# Patient Record
Sex: Female | Born: 1985 | Race: Black or African American | Hispanic: Yes | Marital: Married | Smoking: Former smoker
Health system: Southern US, Community
[De-identification: ages and names within clinical notes are randomized; demographics above are authoritative.]

## PROBLEM LIST (undated history)

## (undated) ENCOUNTER — Emergency Department: Admission: EM | Payer: Self-pay | Source: Home / Self Care

## (undated) DIAGNOSIS — N390 Urinary tract infection, site not specified: Secondary | ICD-10-CM

## (undated) DIAGNOSIS — D649 Anemia, unspecified: Secondary | ICD-10-CM

## (undated) DIAGNOSIS — E282 Polycystic ovarian syndrome: Secondary | ICD-10-CM

## (undated) HISTORY — DX: Polycystic ovarian syndrome: E28.2

## (undated) HISTORY — PX: LIPOSUCTION: SHX10

## (undated) HISTORY — PX: BREAST SURGERY: SHX581

---
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
## 2004-10-11 ENCOUNTER — Emergency Department: Payer: Self-pay | Admitting: Emergency Medicine

## 2005-01-29 ENCOUNTER — Emergency Department: Payer: Self-pay | Admitting: Emergency Medicine

## 2006-05-09 ENCOUNTER — Emergency Department: Payer: Self-pay | Admitting: Emergency Medicine

## 2006-05-10 ENCOUNTER — Emergency Department: Payer: Self-pay | Admitting: Emergency Medicine

## 2006-09-11 ENCOUNTER — Emergency Department: Payer: Self-pay | Admitting: Internal Medicine

## 2007-02-27 ENCOUNTER — Observation Stay: Payer: Self-pay

## 2007-03-19 ENCOUNTER — Observation Stay: Payer: Self-pay

## 2007-04-22 ENCOUNTER — Inpatient Hospital Stay: Payer: Self-pay | Admitting: Unknown Physician Specialty

## 2007-09-03 ENCOUNTER — Emergency Department: Payer: Self-pay | Admitting: Emergency Medicine

## 2008-01-05 ENCOUNTER — Emergency Department: Payer: Self-pay | Admitting: Emergency Medicine

## 2009-04-08 ENCOUNTER — Emergency Department: Payer: Self-pay | Admitting: Emergency Medicine

## 2009-10-29 ENCOUNTER — Emergency Department: Payer: Self-pay | Admitting: Emergency Medicine

## 2010-03-20 ENCOUNTER — Emergency Department: Payer: Self-pay | Admitting: Emergency Medicine

## 2010-03-25 ENCOUNTER — Emergency Department: Payer: Self-pay | Admitting: Emergency Medicine

## 2010-03-27 ENCOUNTER — Emergency Department: Payer: Self-pay | Admitting: Emergency Medicine

## 2010-03-28 ENCOUNTER — Emergency Department: Payer: Self-pay | Admitting: Internal Medicine

## 2010-03-30 ENCOUNTER — Emergency Department: Payer: Self-pay | Admitting: Emergency Medicine

## 2010-05-22 ENCOUNTER — Emergency Department: Payer: Self-pay | Admitting: Emergency Medicine

## 2011-02-24 ENCOUNTER — Emergency Department: Payer: Self-pay | Admitting: Emergency Medicine

## 2011-02-24 LAB — URINALYSIS, COMPLETE
Ketone: NEGATIVE
Nitrite: NEGATIVE
Ph: 5 (ref 4.5–8.0)
Protein: NEGATIVE
RBC,UR: 3 /HPF (ref 0–5)
Specific Gravity: 1.021 (ref 1.003–1.030)

## 2011-02-24 LAB — PREGNANCY, URINE: Pregnancy Test, Urine: NEGATIVE m[IU]/mL

## 2011-09-13 ENCOUNTER — Emergency Department: Payer: Self-pay | Admitting: Emergency Medicine

## 2011-09-16 ENCOUNTER — Emergency Department: Payer: Self-pay | Admitting: Emergency Medicine

## 2011-10-13 ENCOUNTER — Emergency Department: Payer: Self-pay | Admitting: Emergency Medicine

## 2011-10-19 ENCOUNTER — Emergency Department: Payer: Self-pay | Admitting: Emergency Medicine

## 2011-10-19 LAB — URINALYSIS, COMPLETE
Bacteria: NONE SEEN
Bilirubin,UR: NEGATIVE
Ketone: NEGATIVE
Ph: 7 (ref 4.5–8.0)
RBC,UR: 6 /HPF (ref 0–5)
WBC UR: 4 /HPF (ref 0–5)

## 2011-10-19 LAB — CBC
HCT: 39.9 % (ref 35.0–47.0)
HGB: 13.4 g/dL (ref 12.0–16.0)
MCH: 32.2 pg (ref 26.0–34.0)
MCHC: 33.5 g/dL (ref 32.0–36.0)
RDW: 12.9 % (ref 11.5–14.5)
WBC: 8.7 10*3/uL (ref 3.6–11.0)

## 2011-10-19 LAB — WET PREP, GENITAL

## 2012-08-23 ENCOUNTER — Emergency Department: Payer: Self-pay | Admitting: Emergency Medicine

## 2012-12-06 ENCOUNTER — Emergency Department: Payer: Self-pay | Admitting: Internal Medicine

## 2012-12-09 LAB — BETA STREP CULTURE(ARMC)

## 2013-01-15 ENCOUNTER — Emergency Department: Payer: Self-pay | Admitting: Emergency Medicine

## 2013-07-09 ENCOUNTER — Emergency Department: Payer: Self-pay | Admitting: Emergency Medicine

## 2013-07-28 ENCOUNTER — Emergency Department: Payer: Self-pay | Admitting: Emergency Medicine

## 2013-07-28 LAB — CBC
HCT: 37.3 % (ref 35.0–47.0)
HGB: 12.5 g/dL (ref 12.0–16.0)
MCH: 30.9 pg (ref 26.0–34.0)
MCHC: 33.4 g/dL (ref 32.0–36.0)
MCV: 93 fL (ref 80–100)
PLATELETS: 319 10*3/uL (ref 150–440)
RBC: 4.03 10*6/uL (ref 3.80–5.20)
RDW: 12.7 % (ref 11.5–14.5)
WBC: 8.7 10*3/uL (ref 3.6–11.0)

## 2013-07-29 LAB — URINALYSIS, COMPLETE
Bilirubin,UR: NEGATIVE
Glucose,UR: NEGATIVE mg/dL (ref 0–75)
Ketone: NEGATIVE
Nitrite: POSITIVE
PH: 6 (ref 4.5–8.0)
Protein: NEGATIVE
Specific Gravity: 1.028 (ref 1.003–1.030)

## 2013-07-29 LAB — WET PREP, GENITAL

## 2013-07-29 LAB — GC/CHLAMYDIA PROBE AMP

## 2013-08-18 ENCOUNTER — Emergency Department: Payer: Self-pay | Admitting: Emergency Medicine

## 2014-04-30 LAB — HM HIV SCREENING LAB: HM HIV Screening: NEGATIVE

## 2014-04-30 LAB — HM PAP SMEAR: HM Pap smear: NEGATIVE

## 2014-09-26 ENCOUNTER — Emergency Department
Admission: EM | Admit: 2014-09-26 | Discharge: 2014-09-26 | Disposition: A | Discharge status: Home / Self Care | Payer: Self-pay | Attending: Emergency Medicine | Admitting: Emergency Medicine

## 2014-09-26 ENCOUNTER — Encounter: Payer: Self-pay | Admitting: Emergency Medicine

## 2014-09-26 DIAGNOSIS — N390 Urinary tract infection, site not specified: Secondary | ICD-10-CM | POA: Insufficient documentation

## 2014-09-26 DIAGNOSIS — Z3202 Encounter for pregnancy test, result negative: Secondary | ICD-10-CM | POA: Insufficient documentation

## 2014-09-26 LAB — URINALYSIS COMPLETE WITH MICROSCOPIC (ARMC ONLY)
Bilirubin Urine: NEGATIVE
GLUCOSE, UA: NEGATIVE mg/dL
Ketones, ur: NEGATIVE mg/dL
Nitrite: POSITIVE — AB
Protein, ur: 100 mg/dL — AB
Specific Gravity, Urine: 1.015 (ref 1.005–1.030)
pH: 5 (ref 5.0–8.0)

## 2014-09-26 LAB — POCT PREGNANCY, URINE: Preg Test, Ur: NEGATIVE

## 2014-09-26 MED ORDER — SULFAMETHOXAZOLE-TRIMETHOPRIM 800-160 MG PO TABS: 1.0000 | ORAL_TABLET | Freq: Two times a day (BID) | ORAL | Status: DC

## 2014-09-26 MED ORDER — PHENAZOPYRIDINE HCL 100 MG PO TABS: ORAL_TABLET | ORAL | Status: DC

## 2014-09-26 NOTE — ED Provider Notes (Signed)
[facility] Emergency Department Provider Note  ____________________________________________  Time seen: Approximately 12:56 PM  I have reviewed the triage vital signs and the nursing notes.   HISTORY  Chief Complaint Urinary Frequency   HPI Rachel Brennan is a 29 y.o. female is here complaining of urinary frequency for approximately one week. She states that now she is only able to void small amounts at a time. She has had a history of urinary tract infections but not any recently. She denies any nausea,  vomiting or fever.She denies any history of kidney stones. Pain is 6 out of 10 and constant.   History reviewed. No pertinent past medical history.  There are no active problems to display for this patient.   History reviewed. No pertinent past surgical history.  Current Outpatient Rx  Name  Route  Sig  Dispense  Refill  . phenazopyridine (PYRIDIUM) 100 MG tablet      TAKE 1-2 TID prn urinary problems   20 tablet   0   . sulfamethoxazole-trimethoprim (BACTRIM DS,SEPTRA DS) 800-160 MG per tablet   Oral   Take 1 tablet by mouth 2 (two) times daily.   20 tablet   0     Allergies Review of patient's allergies indicates no known allergies.  No family history on file.  Social History Social History  Substance Use Topics  . Smoking status: Never Smoker   . Smokeless tobacco: None  . Alcohol Use: Yes    Review of Systems Constitutional: No fever/chills ENT: No sore throat. Cardiovascular: Denies chest pain. Respiratory: Denies shortness of breath. Gastrointestinal: .  No nausea, no vomiting.  No diarrhea.  No constipation. Genitourinary: Positive for dysuria. Musculoskeletal: Positive for bilateral low back pain Skin: Negative for rash. Neurological: Negative for headaches, focal weakness or numbness.  10-point ROS otherwise negative.  ____________________________________________   PHYSICAL EXAM:  VITAL SIGNS: ED Triage  Vitals  Enc Vitals Group     BP 09/26/14 1144 128/88 mmHg     Pulse Rate 09/26/14 1144 83     Resp 09/26/14 1144 18     Temp 09/26/14 1144 98.4 F (36.9 C)     Temp Source 09/26/14 1144 Oral     SpO2 09/26/14 1144 98 %     Weight 09/26/14 1144 185 lb (83.915 kg)     Height 09/26/14 1144 5\' 1"  (1.549 m)     Head Cir --      Peak Flow --      Pain Score 09/26/14 1144 6     Pain Loc --      Pain Edu? --      Excl. in [place]? --     Constitutional: Alert and oriented. Well appearing and in no acute distress. Eyes: Conjunctivae are normal. PERRL. EOMI. Head: Atraumatic. Nose: No congestion/rhinnorhea. Neck: No stridor.   Cardiovascular: Normal rate, regular rhythm. Grossly normal heart sounds.  Good peripheral circulation. Respiratory: Normal respiratory effort.  No retractions. Lungs CTAB. Gastrointestinal: Soft and nontender. No distention. Bowel sounds normoactive 4 quadrants Musculoskeletal: No lower extremity tenderness nor edema.  No joint effusions. Neurologic:  Normal speech and language. No gross focal neurologic deficits are appreciated. No gait instability. Skin:  Skin is warm, dry and intact. No rash noted. Psychiatric: Mood and affect are normal. Speech and behavior are normal.  ____________________________________________   LABS (all labs ordered are listed, but only abnormal results are displayed)  Labs Reviewed  URINALYSIS COMPLETEWITH MICROSCOPIC ([place]) - Abnormal; Notable for  the following:    Color, Urine YELLOW (*)    APPearance CLOUDY (*)    Hgb urine dipstick 3+ (*)    Protein, ur 100 (*)    Nitrite POSITIVE (*)    Leukocytes, UA 3+ (*)    Bacteria, UA MANY (*)    Squamous Epithelial / LPF 6-30 (*)    All other components within normal limits  POCT PREGNANCY, URINE  POC URINE PREG, ED    PROCEDURES  Procedure(s) performed: None  Critical Care performed: No  ____________________________________________   INITIAL IMPRESSION / ASSESSMENT  AND PLAN / ED COURSE  Pertinent labs & imaging results that were available during my care of the patient were reviewed by me and considered in my medical decision making (see chart for details).  Patient is aware of her urinalysis. She is placed on antibiotic's to take for the next 10 days. ____________________________________________   FINAL CLINICAL IMPRESSION(S) / ED DIAGNOSES  Final diagnoses:  Acute urinary tract infection      Johnn Hai, PA-C 09/26/14 1328  Harvest Dark, MD 09/26/14 [phone]

## 2014-09-26 NOTE — ED Notes (Signed)
Urinary freq and flank pain for 1 week

## 2014-09-26 NOTE — ED Notes (Signed)
Urinary freq for about 1 week   Voiding only small amts at a time

## 2015-02-07 ENCOUNTER — Emergency Department
Admission: EM | Admit: 2015-02-07 | Discharge: 2015-02-07 | Disposition: A | Discharge status: Home / Self Care | Payer: Medicaid Other | Attending: Emergency Medicine | Admitting: Emergency Medicine

## 2015-02-07 ENCOUNTER — Encounter: Payer: Self-pay | Admitting: *Deleted

## 2015-02-07 DIAGNOSIS — Z3202 Encounter for pregnancy test, result negative: Secondary | ICD-10-CM | POA: Diagnosis not present

## 2015-02-07 DIAGNOSIS — Z79899 Other long term (current) drug therapy: Secondary | ICD-10-CM | POA: Diagnosis not present

## 2015-02-07 DIAGNOSIS — N390 Urinary tract infection, site not specified: Secondary | ICD-10-CM | POA: Insufficient documentation

## 2015-02-07 DIAGNOSIS — Z792 Long term (current) use of antibiotics: Secondary | ICD-10-CM | POA: Diagnosis not present

## 2015-02-07 DIAGNOSIS — N898 Other specified noninflammatory disorders of vagina: Secondary | ICD-10-CM | POA: Diagnosis present

## 2015-02-07 LAB — BASIC METABOLIC PANEL
Anion gap: 7 (ref 5–15)
BUN: 15 mg/dL (ref 6–20)
CO2: 24 mmol/L (ref 22–32)
Calcium: 9.1 mg/dL (ref 8.9–10.3)
Chloride: 106 mmol/L (ref 101–111)
Creatinine, Ser: 0.88 mg/dL (ref 0.44–1.00)
GFR calc non Af Amer: >60 mL/min (ref >60)
Glucose, Bld: 92 mg/dL (ref 65–99)
POTASSIUM: 4.3 mmol/L (ref 3.5–5.1)
Sodium: 137 mmol/L (ref 135–145)

## 2015-02-07 LAB — CBC
HCT: 37.8 % (ref 35.0–47.0)
Hemoglobin: 12.4 g/dL (ref 12.0–16.0)
MCH: 29.2 pg (ref 26.0–34.0)
MCHC: 32.8 g/dL (ref 32.0–36.0)
MCV: 89.2 fL (ref 80.0–100.0)
PLATELETS: 301 10*3/uL (ref 150–440)
RBC: 4.24 MIL/uL (ref 3.80–5.20)
RDW: 13.1 % (ref 11.5–14.5)
WBC: 10.9 10*3/uL (ref 3.6–11.0)

## 2015-02-07 LAB — URINALYSIS COMPLETE WITH MICROSCOPIC (ARMC ONLY)
Bilirubin Urine: NEGATIVE
Glucose, UA: 50 mg/dL — AB
Hgb urine dipstick: NEGATIVE
KETONES UR: NEGATIVE mg/dL
NITRITE: POSITIVE — AB
PH: 7 (ref 5.0–8.0)
PROTEIN: NEGATIVE mg/dL
SPECIFIC GRAVITY, URINE: 1.023 (ref 1.005–1.030)

## 2015-02-07 LAB — WET PREP, GENITAL
Clue Cells Wet Prep HPF POC: NONE SEEN
Sperm: NONE SEEN
Trich, Wet Prep: NONE SEEN
WBC, Wet Prep HPF POC: NONE SEEN
Yeast Wet Prep HPF POC: NONE SEEN

## 2015-02-07 LAB — CHLAMYDIA/NGC RT PCR (ARMC ONLY)
CHLAMYDIA TR: NOT DETECTED
N GONORRHOEAE: NOT DETECTED

## 2015-02-07 LAB — POCT PREGNANCY, URINE: Preg Test, Ur: NEGATIVE

## 2015-02-07 MED ORDER — CIPROFLOXACIN HCL 500 MG PO TABS
500.0000 mg | ORAL_TABLET | Freq: Once | ORAL | Status: AC
  Administered 2015-02-07: 500 mg via ORAL
  Filled 2015-02-07: qty 1

## 2015-02-07 MED ORDER — CIPROFLOXACIN HCL 500 MG PO TABS: 500.0000 mg | ORAL_TABLET | Freq: Two times a day (BID) | ORAL | Status: AC

## 2015-02-07 NOTE — ED Notes (Signed)
Pt reports vaginal discharge and pain for 3 hours.  Pt also reports dysuria and foul odor.   No back pain.  No vag bleeding.

## 2015-02-07 NOTE — ED Notes (Signed)
Call called and notified of add on urine culture. States they will run it.

## 2015-02-07 NOTE — Discharge Instructions (Signed)
Urinary Tract Infection A urinary tract infection (UTI) can occur any place along the urinary tract. The tract includes the kidneys, ureters, bladder, and urethra. A type of germ called bacteria often causes a UTI. UTIs are often helped with antibiotic medicine.  HOME CARE   If given, take antibiotics as told by your doctor. Finish them even if you start to feel better.  Drink enough fluids to keep your pee (urine) clear or pale yellow.  Avoid tea, drinks with caffeine, and bubbly (carbonated) drinks.  Pee often. Avoid holding your pee in for a long time.  Pee before and after having sex (intercourse).  Wipe from front to back after you poop (bowel movement) if you are a woman. Use each tissue only once. GET HELP RIGHT AWAY IF:   You have back pain.  You have lower belly (abdominal) pain.  You have chills.  You feel sick to your stomach (nauseous).  You throw up (vomit).  Your burning or discomfort with peeing does not go away.  You have a fever.  Your symptoms are not better in 3 days. MAKE SURE YOU:   Understand these instructions.  Will watch your condition.  Will get help right away if you are not doing well or get worse.   This information is not intended to replace advice given to you by your health care provider. Make sure you discuss any questions you have with your health care provider.   Document Released: 06/08/2007 Document Revised: 01/10/2014 Document Reviewed: 07/21/2011 Elsevier Interactive Patient Education 2016 [employer].   Take antibiotic as directed. Symptoms  should significantly improve over the next couple days. follow-up with your physician for any concerns or return to the emergency room as needed.

## 2015-02-07 NOTE — ED Provider Notes (Signed)
[facility] Emergency Department Provider Note  ____________________________________________  Time seen: Approximately 8:30 PM  I have reviewed the triage vital signs and the nursing notes.   HISTORY  Chief Complaint Vaginal Discharge and Dysuria    HPI Rachel Brennan is a 30 y.o. female who presents with 1 day of vaginal discomfort with discharge. She has noted foul-smelling urine and discomfort over the lower midabdomen. No fevers and chills. No nausea or vomiting. No back pain.She has rare sexual encounters with another female and denies female sexual activity   No past medical history on file.  There are no active problems to display for this patient.   No past surgical history on file.  Current Outpatient Rx  Name  Route  Sig  Dispense  Refill  . ciprofloxacin (CIPRO) 500 MG tablet   Oral   Take 1 tablet (500 mg total) by mouth 2 (two) times daily.   10 tablet   0   . phenazopyridine (PYRIDIUM) 100 MG tablet      TAKE 1-2 TID prn urinary problems   20 tablet   0   . sulfamethoxazole-trimethoprim (BACTRIM DS,SEPTRA DS) 800-160 MG per tablet   Oral   Take 1 tablet by mouth 2 (two) times daily.   20 tablet   0     Allergies Review of patient's allergies indicates no known allergies.  No family history on file.  Social History Social History  Substance Use Topics  . Smoking status: Never Smoker   . Smokeless tobacco: None  . Alcohol Use: Yes    Review of Systems Constitutional: No fever/chills Eyes: No visual changes. ENT: No sore throat. Cardiovascular: Denies chest pain. Respiratory: Denies shortness of breath. Gastrointestinal: lower mid abdominal pain.  No nausea, no vomiting.  No diarrhea.  No constipation. Genitourinary: pos for dysuria. Musculoskeletal: Negative for back pain. Skin: Negative for rash. Neurological: Negative for headaches, focal weakness or numbness. 10-point ROS otherwise  negative.  ____________________________________________   PHYSICAL EXAM:  VITAL SIGNS: ED Triage Vitals  Enc Vitals Group     BP 02/07/15 1710 121/92 mmHg     Pulse Rate 02/07/15 1710 87     Resp 02/07/15 1710 18     Temp 02/07/15 1710 98.6 F (37 C)     Temp Source 02/07/15 1710 Oral     SpO2 02/07/15 1710 99 %     Weight 02/07/15 1710 175 lb (79.379 kg)     Height 02/07/15 1710 5\' 1"  (1.549 m)     Head Cir --      Peak Flow --      Pain Score 02/07/15 2021 7     Pain Loc --      Pain Edu? --      Excl. in [place]? --     Constitutional: Alert and oriented. Well appearing and in no acute distress. Eyes: Conjunctivae are normal. PERRL. EOMI. Head: Atraumatic. Nose: No congestion/rhinnorhea. Cardiovascular: Normal rate, regular rhythm. Grossly normal heart sounds.  Good peripheral circulation. Respiratory: Normal respiratory effort.  No retractions. Lungs CTAB. Gastrointestinal: Soft and nontender. No distention. No abdominal bruits.  GU: White vaginal discharge noted. Mild tenderness on exam. Neurologic:  Normal speech and language. No gross focal neurologic deficits are appreciated. No gait instability. Skin:  Skin is warm, dry and intact. No rash noted. Psychiatric: Mood and affect are normal. Speech and behavior are normal.  ____________________________________________   LABS (all labs ordered are listed, but only abnormal results are displayed)  Labs Reviewed  URINALYSIS COMPLETEWITH MICROSCOPIC ([facility] ONLY) - Abnormal; Notable for the following:    Color, Urine YELLOW (*)    APPearance CLOUDY (*)    Glucose, UA 50 (*)    Nitrite POSITIVE (*)    Leukocytes, UA TRACE (*)    Bacteria, UA MANY (*)    Squamous Epithelial / LPF TOO NUMEROUS TO COUNT (*)    All other components within normal limits  WET PREP, GENITAL  CHLAMYDIA/NGC RT PCR ([facility] ONLY)  URINE CULTURE  CBC  BASIC METABOLIC PANEL  POC URINE PREG, ED  POCT PREGNANCY, URINE    ____________________________________________  EKG   ____________________________________________  RADIOLOGY   ____________________________________________   PROCEDURES  Procedure(s) performed: None  Critical Care performed: No  ____________________________________________   INITIAL IMPRESSION / ASSESSMENT AND PLAN / ED COURSE  Pertinent labs & imaging results that were available during my care of the patient were reviewed by me and considered in my medical decision making (see chart for details).  30 year old female with acute onset of dysuria as well as vaginal pain. She is not sexually active with another female. Negative wet prep. Her urine shows infection and she is given Cipro. She can follow-up with her physician as needed, or return to the [facility] room for any concerns. ____________________________________________   FINAL CLINICAL IMPRESSION(S) / ED DIAGNOSES  Final diagnoses:  UTI (lower urinary tract infection)      Mortimer Fries, PA-C 02/07/15 2124  Delman Kitten, MD 02/07/15 2251

## 2015-02-09 LAB — URINE CULTURE

## 2015-03-07 ENCOUNTER — Observation Stay
Admission: EM | Admit: 2015-03-07 | Discharge: 2015-03-08 | Disposition: A | Discharge status: Home / Self Care | Payer: Medicaid Other | Attending: Internal Medicine | Admitting: Internal Medicine

## 2015-03-07 ENCOUNTER — Emergency Department: Payer: Medicaid Other

## 2015-03-07 DIAGNOSIS — D649 Anemia, unspecified: Secondary | ICD-10-CM | POA: Diagnosis present

## 2015-03-07 DIAGNOSIS — K922 Gastrointestinal hemorrhage, unspecified: Secondary | ICD-10-CM | POA: Insufficient documentation

## 2015-03-07 DIAGNOSIS — R300 Dysuria: Secondary | ICD-10-CM | POA: Diagnosis not present

## 2015-03-07 DIAGNOSIS — R195 Other fecal abnormalities: Secondary | ICD-10-CM | POA: Diagnosis not present

## 2015-03-07 DIAGNOSIS — R112 Nausea with vomiting, unspecified: Secondary | ICD-10-CM | POA: Diagnosis not present

## 2015-03-07 DIAGNOSIS — Z87440 Personal history of urinary (tract) infections: Secondary | ICD-10-CM | POA: Diagnosis not present

## 2015-03-07 DIAGNOSIS — R319 Hematuria, unspecified: Secondary | ICD-10-CM | POA: Diagnosis not present

## 2015-03-07 DIAGNOSIS — Z79899 Other long term (current) drug therapy: Secondary | ICD-10-CM | POA: Diagnosis not present

## 2015-03-07 DIAGNOSIS — D509 Iron deficiency anemia, unspecified: Secondary | ICD-10-CM | POA: Insufficient documentation

## 2015-03-07 DIAGNOSIS — N201 Calculus of ureter: Secondary | ICD-10-CM

## 2015-03-07 DIAGNOSIS — N132 Hydronephrosis with renal and ureteral calculous obstruction: Principal | ICD-10-CM | POA: Insufficient documentation

## 2015-03-07 DIAGNOSIS — R1031 Right lower quadrant pain: Secondary | ICD-10-CM | POA: Diagnosis not present

## 2015-03-07 DIAGNOSIS — Z8249 Family history of ischemic heart disease and other diseases of the circulatory system: Secondary | ICD-10-CM | POA: Insufficient documentation

## 2015-03-07 DIAGNOSIS — D1803 Hemangioma of intra-abdominal structures: Secondary | ICD-10-CM | POA: Insufficient documentation

## 2015-03-07 DIAGNOSIS — N1330 Unspecified hydronephrosis: Secondary | ICD-10-CM

## 2015-03-07 HISTORY — DX: Urinary tract infection, site not specified: N39.0

## 2015-03-07 LAB — COMPREHENSIVE METABOLIC PANEL
ALBUMIN: 3.9 g/dL (ref 3.5–5.0)
ALK PHOS: 62 U/L (ref 38–126)
ALT: 13 U/L — AB (ref 14–54)
AST: 19 U/L (ref 15–41)
Anion gap: 7 (ref 5–15)
BUN: 14 mg/dL (ref 6–20)
CALCIUM: 9 mg/dL (ref 8.9–10.3)
CO2: 23 mmol/L (ref 22–32)
CREATININE: 0.69 mg/dL (ref 0.44–1.00)
Chloride: 109 mmol/L (ref 101–111)
GFR calc Af Amer: >60 mL/min (ref >60)
GLUCOSE: 120 mg/dL — AB (ref 65–99)
POTASSIUM: 4.2 mmol/L (ref 3.5–5.1)
Sodium: 139 mmol/L (ref 135–145)
TOTAL PROTEIN: 7.1 g/dL (ref 6.5–8.1)

## 2015-03-07 LAB — URINALYSIS COMPLETE WITH MICROSCOPIC (ARMC ONLY)
BILIRUBIN URINE: NEGATIVE
GLUCOSE, UA: NEGATIVE mg/dL
NITRITE: NEGATIVE
PROTEIN: 100 mg/dL — AB
Specific Gravity, Urine: 1.018 (ref 1.005–1.030)
pH: 8 (ref 5.0–8.0)

## 2015-03-07 LAB — LIPASE, BLOOD: Lipase: 18 U/L (ref 11–51)

## 2015-03-07 LAB — POCT PREGNANCY, URINE: PREG TEST UR: NEGATIVE

## 2015-03-07 MED ORDER — DEXTROSE 5 % IV SOLN
1.0000 g | Freq: Once | INTRAVENOUS | Status: AC
  Administered 2015-03-07: 1 g via INTRAVENOUS
  Filled 2015-03-07: qty 10

## 2015-03-07 MED ORDER — HYDROMORPHONE HCL 1 MG/ML IJ SOLN
1.0000 mg | INTRAMUSCULAR | Status: AC
  Administered 2015-03-07: 1 mg via INTRAVENOUS
  Filled 2015-03-07: qty 1

## 2015-03-07 MED ORDER — ONDANSETRON HCL 4 MG/2ML IJ SOLN
4.0000 mg | Freq: Four times a day (QID) | INTRAMUSCULAR | Status: DC | PRN
  Filled 2015-03-07: qty 2

## 2015-03-07 MED ORDER — MORPHINE SULFATE (PF) 2 MG/ML IV SOLN
2.0000 mg | INTRAVENOUS | Status: DC | PRN
  Administered 2015-03-07: 2 mg via INTRAVENOUS
  Filled 2015-03-07: qty 1

## 2015-03-07 MED ORDER — HYDROMORPHONE HCL 1 MG/ML IJ SOLN
0.5000 mg | Freq: Once | INTRAMUSCULAR | Status: AC
  Administered 2015-03-07: 0.5 mg via INTRAVENOUS
  Filled 2015-03-07: qty 1

## 2015-03-07 MED ORDER — IOHEXOL 300 MG/ML  SOLN
100.0000 mL | Freq: Once | INTRAMUSCULAR | Status: AC | PRN
  Administered 2015-03-07: 100 mL via INTRAVENOUS

## 2015-03-07 MED ORDER — IOHEXOL 240 MG/ML SOLN
25.0000 mL | Freq: Once | INTRAMUSCULAR | Status: AC | PRN
  Administered 2015-03-07: 25 mL via ORAL

## 2015-03-07 MED ORDER — HYDROMORPHONE HCL 1 MG/ML IJ SOLN: 1.0000 mg | INTRAMUSCULAR | Status: DC | PRN

## 2015-03-07 NOTE — H&P (Signed)
[place] [facility] [place] NAME: Rachel Brennan    MR#:  [id number]  DATE OF BIRTH:  1985-10-11  DATE OF ADMISSION:  03/07/2015  PRIMARY CARE PHYSICIAN: No PCP Per Patient   REQUESTING/REFERRING PHYSICIAN: [email or web address].  CHIEF COMPLAINT:   Chief Complaint  Patient presents with  . Abdominal Pain    HISTORY OF PRESENT ILLNESS: Rachel Brennan  is a 30 y.o. female with a known history of UTI 1 month ago- today morning woke up with severe pain on her right lower abdomen which was going to her groin and to her back and felt nauseous with that, and then she went to urinate she started having frank blood in the urine. So called EMS and came to emergency room. In ER on CT scan she was noted to have a ureteral stone on the right side with some hydronephrosis, and her hemoglobin had significant drop compared to last month. As per her her menstrual periods are regular and they are nothing more than her usual periods, but when ER physician checked for guaiac her stool was positive for blood. She denies any vomiting or not noted any blood in the stool, she never had colonoscopy. She denies using over-the-counter pain medications, aspirin, BC powders. She denies drinking excessive alcohol, or gastric reflux disease.  PAST MEDICAL HISTORY:   Past Medical History  Diagnosis Date  . UTI (lower urinary tract infection)     PAST SURGICAL HISTORY: History reviewed. No pertinent past surgical history.  SOCIAL HISTORY:  Social History  Substance Use Topics  . Smoking status: Never Smoker   . Smokeless tobacco: Not on file  . Alcohol Use: Yes    FAMILY HISTORY:  Family History  Problem Relation Age of Onset  . Hypertension Father     DRUG ALLERGIES: No Known Allergies  REVIEW OF SYSTEMS:   CONSTITUTIONAL: No fever, fatigue or weakness.  EYES: No blurred or double vision.  EARS, NOSE, AND THROAT: No tinnitus or ear pain.  RESPIRATORY: No cough,  shortness of breath, wheezing or hemoptysis.  CARDIOVASCULAR: No chest pain, orthopnea, edema.  GASTROINTESTINAL: positive nausea, vomiting, no diarrhea , positive for abdominal pain.  GENITOURINARY: No dysuria,but have frank hematuria.  ENDOCRINE: No polyuria, nocturia,  HEMATOLOGY: No anemia, easy bruising or bleeding SKIN: No rash or lesion. MUSCULOSKELETAL: No joint pain or arthritis.   NEUROLOGIC: No tingling, numbness, weakness.  PSYCHIATRY: No anxiety or depression.   MEDICATIONS AT HOME:  Prior to Admission medications   Medication Sig Start Date End Date Taking? Authorizing Provider  phenazopyridine (PYRIDIUM) 100 MG tablet TAKE 1-2 TID prn urinary problems 09/26/14   Johnn Hai, PA-C  sulfamethoxazole-trimethoprim (BACTRIM DS,SEPTRA DS) 800-160 MG per tablet Take 1 tablet by mouth 2 (two) times daily. 09/26/14   Johnn Hai, PA-C      PHYSICAL EXAMINATION:   VITAL SIGNS: Blood pressure 137/96, pulse 76, temperature 98 F (36.7 C), temperature source Oral, resp. rate 18, height 5\' 1"  (1.549 m), weight 80.287 kg (177 lb), last menstrual period 02/16/2015, SpO2 100 %.  GENERAL:  30 y.o.-year-old patient lying in the bed with no acute distress.  EYES: Pupils equal, round, reactive to light and accommodation. No scleral icterus. Extraocular muscles intact.  HEENT: Head atraumatic, normocephalic. Oropharynx and nasopharynx clear.  NECK:  Supple, no jugular venous distention. No thyroid enlargement, no tenderness.  LUNGS: Normal breath sounds bilaterally, no wheezing, rales,rhonchi or crepitation. No use of accessory muscles of respiration.  CARDIOVASCULAR: S1, S2 normal. No murmurs, rubs, or gallops.  ABDOMEN: Soft, tender on right lower, nondistended. Bowel sounds present. No organomegaly or mass.  EXTREMITIES: No pedal edema, cyanosis, or clubbing.  NEUROLOGIC: Cranial nerves II through XII are intact. Muscle strength 5/5 in all extremities. Sensation intact. Gait not  checked.  PSYCHIATRIC: The patient is alert and oriented x 3.  SKIN: No obvious rash, lesion, or ulcer.   LABORATORY PANEL:   CBC  Recent Labs Lab 03/07/15 1422  WBC 7.0  HGB 7.2*  HCT 22.3*  PLT 200  MCV 90.2  MCH 29.3  MCHC 32.5  RDW 13.3   ------------------------------------------------------------------------------------------------------------------  Chemistries   Recent Labs Lab 03/07/15 1508  NA 139  K 4.2  CL 109  CO2 23  GLUCOSE 120*  BUN 14  CREATININE 0.69  CALCIUM 9.0  AST 19  ALT 13*  ALKPHOS 62  BILITOT <0.1*   ------------------------------------------------------------------------------------------------------------------ estimated creatinine clearance is 97.8 mL/min (by C-G formula based on Cr of 0.69). ------------------------------------------------------------------------------------------------------------------ No results for input(s): TSH, T4TOTAL, T3FREE, THYROIDAB in the last 72 hours.  Invalid input(s): FREET3   Coagulation profile No results for input(s): INR, PROTIME in the last 168 hours. ------------------------------------------------------------------------------------------------------------------- No results for input(s): DDIMER in the last 72 hours. -------------------------------------------------------------------------------------------------------------------  Cardiac Enzymes No results for input(s): CKMB, TROPONINI, MYOGLOBIN in the last 168 hours.  Invalid input(s): CK ------------------------------------------------------------------------------------------------------------------ Invalid input(s): POCBNP  ---------------------------------------------------------------------------------------------------------------  Urinalysis    Component Value Date/Time   COLORURINE YELLOW* 03/07/2015 1608   COLORURINE Yellow 07/28/2013 2343   APPEARANCEUR TURBID* 03/07/2015 1608   APPEARANCEUR Hazy 07/28/2013 2343    LABSPEC 1.018 03/07/2015 1608   LABSPEC 1.028 07/28/2013 2343   PHURINE 8.0 03/07/2015 1608   PHURINE 6.0 07/28/2013 2343   GLUCOSEU NEGATIVE 03/07/2015 1608   GLUCOSEU Negative 07/28/2013 2343   HGBUR 3+* 03/07/2015 1608   HGBUR 2+ 07/28/2013 [place] 03/07/2015 1608   BILIRUBINUR Negative 07/28/2013 [place]* 03/07/2015 1608   KETONESUR Negative 07/28/2013 2343   PROTEINUR 100* 03/07/2015 1608   PROTEINUR Negative 07/28/2013 2343   NITRITE NEGATIVE 03/07/2015 1608   NITRITE Positive 07/28/2013 2343   LEUKOCYTESUR TRACE* 03/07/2015 1608   LEUKOCYTESUR Trace 07/28/2013 2343     RADIOLOGY: Ct Abdomen Pelvis W Contrast  03/07/2015  CLINICAL DATA:  Acute onset right flank pain and right lower quadrant pain this morning. nausea and vomiting. EXAM: CT ABDOMEN AND PELVIS WITH CONTRAST TECHNIQUE: Multidetector CT imaging of the abdomen and pelvis was performed using the standard protocol following bolus administration of intravenous contrast. CONTRAST:  170mL OMNIPAQUE IOHEXOL 300 MG/ML  SOLN COMPARISON:  None. FINDINGS: Lower chest:  No acute findings. Hepatobiliary: 1.9 cm lesion with peripheral nodular enhancement seen in the inferior right hepatic lobe consistent with a small benign hemangioma. Sub-cm low-attenuation lesion in the medial right hepatic lobe is too small to characterize by CT but is also likely benign. Gallbladder is unremarkable. Pancreas: No mass, inflammatory changes, or other significant abnormality. Spleen: Within normal limits in size and appearance. Adrenals/Urinary Tract: No adrenal masses identified. Tiny 1-2 mm nonobstructive renal calculi seen bilaterally. Mild right hydronephrosis and ureterectasis is seen. A 3 mm distal right ureteral calculus is seen in the upper portion of the pelvis on image 65/series 2. Stomach/Bowel: No evidence of obstruction, inflammatory process, or abnormal fluid collections. Vascular/Lymphatic: No  pathologically enlarged lymph nodes. No evidence of abdominal aortic aneurysm. Reproductive: No mass or other significant abnormality. Other: None. Musculoskeletal:  No suspicious bone lesions identified. IMPRESSION: 3 mm distal right ureteral calculus causing mild right hydronephrosis. Other tiny nonobstructive bilateral intrarenal calculi. Electronically Signed   By: Earle Gell M.D.   On: 03/07/2015 18:04    EKG: Orders placed or performed in visit on 08/18/13  . EKG 12-Lead    IMPRESSION AND PLAN:  * Hematuria  Renal stone with some hydronephrosis on the right side  We will admit for observation currently, she does not require transfusion at this point.  Call urology consult for further management.  Does not appear to have any infection, so no need for antibiotics.  A few WBCs on urinalysis are compatible with heavy blood loss in the urine.  Pain management and nausea management as needed.  * Acute blood loss anemia - gi bleed.  Hemoglobin is significantly lower compared to last month.  Stool is positive for occult blood loss.  No significant change in her menstruations.  We'll call GI consult, she may need further workup as outpatient.    All the records are reviewed and case discussed with ED provider. Management plans discussed with the patient, family and they are in agreement.  CODE STATUS: Code Status History    This patient does not have a recorded code status. Please follow your organizational policy for patients in this situation.       TOTAL TIME TAKING CARE OF THIS PATIENT: 50 minutes.    Vaughan Basta M.D on 03/07/2015   Between 7am to 6pm - Pager - [phone]  After 6pm go to [email or web address] - password [email or web address] [facility]  [facility]  Office  [phone]  CC: Primary care physician; No PCP Per Patient   Note: This dictation was prepared with Dragon dictation along with smaller phrase technology. Any transcriptional errors that result  from this process are unintentional.

## 2015-03-07 NOTE — ED Notes (Signed)
Patient states she was having some gas this morning after she ate the pain went away.  When she went into work the pain came back and was radiating to her back.  Emesis also that is yellow liquid.

## 2015-03-07 NOTE — ED Provider Notes (Signed)
[facility] Emergency Department Provider Note  Time seen: 4:27 PM  I have reviewed the triage vital signs and the nursing notes.   HISTORY  Chief Complaint Abdominal Pain    HPI Rachel Brennan is a 30 y.o. female with no past medical history presents the emergency department with abdominal pain. According to the patient around 6:30 this morning she developed right mid abdominal discomfort. States it began radiating to her back, and she vomited times one which was yellow in color. Patient states the pain then began radiating down to her right lower quadrant. She had one episode of urination today which appeared to be red, but denies any dysuria. Patient was seen in the emergency department one month ago and prescribed ciprofloxacin for a urinary tract infection. Denies any issues since then.     History reviewed. No pertinent past medical history.  There are no active problems to display for this patient.   History reviewed. No pertinent past surgical history.  Current Outpatient Rx  Name  Route  Sig  Dispense  Refill  . phenazopyridine (PYRIDIUM) 100 MG tablet      TAKE 1-2 TID prn urinary problems   20 tablet   0   . sulfamethoxazole-trimethoprim (BACTRIM DS,SEPTRA DS) 800-160 MG per tablet   Oral   Take 1 tablet by mouth 2 (two) times daily.   20 tablet   0     Allergies Review of patient's allergies indicates no known allergies.  No family history on file.  Social History Social History  Substance Use Topics  . Smoking status: Never Smoker   . Smokeless tobacco: None  . Alcohol Use: Yes    Review of Systems Constitutional: Negative for fever. Cardiovascular: Negative for chest pain. Respiratory: Negative for shortness of breath. Gastrointestinal: Right-sided abdominal pain. Positive for nausea and vomiting 1. Mild amount of loose stool today. Genitourinary: Negative for dysuria. Positive hematuria in the emergency  department. Musculoskeletal: Right back pain Skin: Negative for rash. Neurological: Negative for headache 10-point ROS otherwise negative.  ____________________________________________   PHYSICAL EXAM:  VITAL SIGNS: ED Triage Vitals  Enc Vitals Group     BP 03/07/15 1405 139/111 mmHg     Pulse Rate 03/07/15 1405 81     Resp 03/07/15 1405 18     Temp 03/07/15 1405 98 F (36.7 C)     Temp Source 03/07/15 1405 Oral     SpO2 03/07/15 1405 100 %     Weight 03/07/15 1405 177 lb (80.287 kg)     Height 03/07/15 1405 5\' 1"  (1.549 m)     Head Cir --      Peak Flow --      Pain Score 03/07/15 1406 10     Pain Loc --      Pain Edu? --      Excl. in [place]? --     Constitutional: Alert and oriented. Well appearing and in no distress. Eyes: Normal exam ENT   Head: Normocephalic and atraumatic.   Mouth/Throat: Mucous membranes are moist. Cardiovascular: Normal rate, regular rhythm. No murmur Respiratory: Normal respiratory effort without tachypnea nor retractions. Breath sounds are clear Gastrointestinal: Soft, mild left lower quadrant tenderness to palpation, abdomen otherwise benign. No right-sided tenderness. No CVA tenderness. No distention. No rebound or guarding. Musculoskeletal: Nontender with normal range of motion in all extremities. Neurologic:  Normal speech and language. No gross focal neurologic deficits Skin:  Skin is warm, dry and intact.  Psychiatric: Mood and  affect are normal. Speech and behavior are normal.   ____________________________________________     RADIOLOGY  CT consistent with right-sided ureteral stone  ____________________________________________    INITIAL IMPRESSION / ASSESSMENT AND PLAN / ED COURSE  Pertinent labs & imaging results that were available during my care of the patient were reviewed by me and considered in my medical decision making (see chart for details).  Patient presents the emergency department with right-sided  abdominal pain which began around 6:30 this morning. Patient states after receiving pain medication in the emergency department pain is gone. On abdominal exam the patient has mild left lower quadrant tenderness palpation, otherwise benign abdominal exam. Patient's labs show significant decrease in hemoglobin currently 7.4, down from 12 approximate one month ago. Patient states she did have a fairly heavy period this month, but it stopped 2 weeks ago. Denies any black or bloody stool. Denies any black or bloody vomit. Given the patient's abdominal pain today with her hemoglobin drop we'll proceed with a CT abdomen/pelvis with contrast. Patient denies any vaginal bleeding or discharge. Did have a mild amount of hematuria.  CT consistent with right-sided ureteral stone which explains the patient's symptoms. Patient's urine shows a possible urinary tract infection, covered with Rocephin in the emergency department. Patient's hemoglobin level has dropped 5 points. Rectal exam shows what appears to be light brown stool but it is guaiac positive. Given the patient's 5. hemoglobin drop in 1 month along with guaiac positive stool we will admit the patient to the hospital to trend her hemoglobins. Patient states her abdominal pain is well-controlled at this time.  ____________________________________________   FINAL CLINICAL IMPRESSION(S) / ED DIAGNOSES  Right flank pain Left lower quadrant pain Anemia Right ureterolithiasis  Harvest Dark, MD 03/07/15 1850

## 2015-03-07 NOTE — ED Notes (Addendum)
Pt arrives to ER via [facility] c.o burning right lower quadrant pain, NV that began this AM. Pt given 4zofran IV with EMS. 20 to L hand started by EMS. Pt appears uncomfortable, tearful, hyperventilating. Friend at bedside.

## 2015-03-07 NOTE — ED Notes (Signed)
Pt drinking contrast at this time, will notify RN once completes.

## 2015-03-07 NOTE — ED Notes (Signed)
MD Paduchowski at bedside at this time  

## 2015-03-07 NOTE — ED Notes (Signed)
MD Paduchowski at bedside  

## 2015-03-07 NOTE — ED Notes (Signed)
Pt at CT at this time.

## 2015-03-08 LAB — CBC
HEMATOCRIT: 35 % (ref 35.0–47.0)
HEMOGLOBIN: 11.6 g/dL — AB (ref 12.0–16.0)
MCH: 29.2 pg (ref 26.0–34.0)
MCHC: 33.1 g/dL (ref 32.0–36.0)
MCV: 88.3 fL (ref 80.0–100.0)
Platelets: 297 10*3/uL (ref 150–440)
RBC: 3.97 MIL/uL (ref 3.80–5.20)
RDW: 13.1 % (ref 11.5–14.5)
WBC: 9.5 10*3/uL (ref 3.6–11.0)

## 2015-03-08 LAB — BASIC METABOLIC PANEL
ANION GAP: 8 (ref 5–15)
BUN: 12 mg/dL (ref 6–20)
CHLORIDE: 105 mmol/L (ref 101–111)
CO2: 26 mmol/L (ref 22–32)
Calcium: 8.9 mg/dL (ref 8.9–10.3)
Creatinine, Ser: 0.74 mg/dL (ref 0.44–1.00)
GFR calc Af Amer: >60 mL/min (ref >60)
GLUCOSE: 109 mg/dL — AB (ref 65–99)
POTASSIUM: 4.3 mmol/L (ref 3.5–5.1)
Sodium: 139 mmol/L (ref 135–145)

## 2015-03-08 LAB — TYPE AND SCREEN
ABO/RH(D): A POS
Antibody Screen: NEGATIVE

## 2015-03-08 LAB — FERRITIN: Ferritin: 15 ng/mL (ref 11–307)

## 2015-03-08 LAB — ABO/RH: ABO/RH(D): A POS

## 2015-03-08 LAB — HEMOGLOBIN
Hemoglobin: 11.7 g/dL — ABNORMAL LOW (ref 12.0–16.0)
Hemoglobin: 11.9 g/dL — ABNORMAL LOW (ref 12.0–16.0)

## 2015-03-08 LAB — IRON AND TIBC
Iron: 40 ug/dL (ref 28–170)
Saturation Ratios: 10 % — ABNORMAL LOW (ref 10.4–31.8)
TIBC: 403 ug/dL (ref 250–450)
UIBC: 363 ug/dL

## 2015-03-08 MED ORDER — OXYCODONE HCL 5 MG PO TABS: 5.0000 mg | ORAL_TABLET | Freq: Four times a day (QID) | ORAL | Status: DC | PRN

## 2015-03-08 MED ORDER — FERROUS SULFATE 325 (65 FE) MG PO TABS: 325.0000 mg | ORAL_TABLET | Freq: Every day | ORAL | Status: DC

## 2015-03-08 MED ORDER — TAMSULOSIN HCL 0.4 MG PO CAPS: 0.4000 mg | ORAL_CAPSULE | Freq: Every day | ORAL | Status: DC

## 2015-03-08 MED ORDER — CEPHALEXIN 500 MG PO CAPS: 500.0000 mg | ORAL_CAPSULE | Freq: Three times a day (TID) | ORAL | Status: DC

## 2015-03-08 NOTE — Progress Notes (Signed)
Patient discharged home per MD order. Prescriptions given to patient. All discharge instructions given and all questions answered. 

## 2015-03-08 NOTE — Discharge Instructions (Signed)
Kidney Stones  Kidney stones (urolithiasis) are solid masses that form inside your kidneys. The intense pain is caused by the stone moving through the kidney, ureter, bladder, and urethra (urinary tract). When the stone moves, the ureter starts to spasm around the stone. The stone is usually passed in your pee (urine).   HOME CARE  · Drink enough fluids to keep your pee clear or pale yellow. This helps to get the stone out.  · Take a 24-hour pee (urine) sample as told by your doctor. You may need to take another sample every 6-12 months.  · Strain all pee through the provided strainer. Do not pee without peeing through the strainer, not even once. If you pee the stone out, catch it in the strainer. The stone may be as small as a grain of salt. Take this to your doctor. This will help your doctor figure out what you can do to try to prevent more kidney stones.  · Only take medicine as told by your doctor.  · Make changes to your daily diet as told by your doctor. You may be told to:    Limit how much salt you eat.    Eat 5 or more servings of fruits and vegetables each day.    Limit how much meat, poultry, fish, and eggs you eat.  · Keep all follow-up visits as told by your doctor. This is important.  · Get follow-up X-rays as told by your doctor.  GET HELP IF:  You have pain that gets worse even if you have been taking pain medicine.  GET HELP RIGHT AWAY IF:   · Your pain does not get better with medicine.  · You have a fever or shaking chills.  · Your pain increases and gets worse over 18 hours.  · You have new belly (abdominal) pain.  · You feel faint or pass out.  · You are unable to pee.     This information is not intended to replace advice given to you by your health care provider. Make sure you discuss any questions you have with your health care provider.     Document Released: 06/08/2007 Document Revised: 09/10/2014 Document Reviewed: 05/23/2012  Elsevier Interactive Patient Education ©2016 [employer]

## 2015-03-08 NOTE — Discharge Summary (Signed)
[facility] [place] NAME: [name]    MR#:  [id number]  DATE OF BIRTH:  Jan 18, 1985  DATE OF ADMISSION:  03/07/2015 ADMITTING PHYSICIAN: Vaughan Basta, MD  DATE OF DISCHARGE: 03/08/2015 12:12 PM  PRIMARY CARE PHYSICIAN: No PCP Per Patient    ADMISSION DIAGNOSIS:  Ureterolithiasis [N20.1] Lower GI bleed [K92.2] Anemia, unspecified anemia type [D64.9]  DISCHARGE DIAGNOSIS:  Principal Problem:   Hematuria Active Problems:   Hydronephrosis   Anemia   SECONDARY DIAGNOSIS:   Past Medical History  Diagnosis Date  . UTI (lower urinary tract infection)     HOSPITAL COURSE:   1. Nephrolithiasis, distal right ureteral calculus 3 mm causing mild right hydronephrosis. Patient was seen in consultation by urology recommended outpatient Flomax and to stay hydrated. Strain urine and bring to follow-up appointment with urology. Empiric Rocephin started in [place] upon discharge. 2. Iron deficiency anemia. In the ER, a hemoglobin was drawn and was listed as 7.2 hemoglobin. This was likely an error. Because repeat 2 hemoglobins was elevated at 11.6 and 11.9. I prescribed iron upon discharge home. 3. Guaiac positive stool. Can follow-up with gastroenterology Dr. Allen Norris as outpatient.  DISCHARGE CONDITIONS:   Satisfactory  CONSULTS OBTAINED:  Treatment Team:  Dereck Leep, MD Lucilla Lame, MD  DRUG ALLERGIES:  No Known Allergies  DISCHARGE MEDICATIONS:   Discharge Medication List as of 03/08/2015 11:14 AM    START taking these medications   Details  cephALEXin (KEFLEX) 500 MG capsule Take 1 capsule (500 mg total) by mouth every 8 (eight) hours., Starting 03/08/2015, Until Discontinued, Print    ferrous sulfate 325 (65 FE) MG tablet Take 1 tablet (325 mg total) by mouth daily with breakfast., Starting 03/09/2015, Until Discontinued, Print    oxyCODONE (OXY IR/ROXICODONE) 5 MG immediate release tablet Take 1 tablet  (5 mg total) by mouth every 6 (six) hours as needed for moderate pain., Starting 03/08/2015, Until Discontinued, Print    tamsulosin (FLOMAX) 0.4 MG CAPS capsule Take 1 capsule (0.4 mg total) by mouth daily after supper., Starting 03/08/2015, Until Discontinued, Print         DISCHARGE INSTRUCTIONS:   Follow-up urology 1 week Follow-up with gastroenterology 2 weeks  If you experience worsening of your admission symptoms, develop shortness of breath, life threatening emergency, suicidal or homicidal thoughts you must seek medical attention immediately by calling 911 or calling your MD immediately  if symptoms less severe.  You Must read complete instructions/literature along with all the possible adverse reactions/side effects for all the Medicines you take and that have been prescribed to you. Take any new Medicines after you have completely understood and accept all the possible adverse reactions/side effects.   Please note  You were cared for by a hospitalist during your hospital stay. If you have any questions about your discharge medications or the care you received while you were in the hospital after you are discharged, you can call the unit and asked to speak with the hospitalist on call if the hospitalist that took care of you is not available. Once you are discharged, your primary care physician will handle any further medical issues. Please note that NO REFILLS for any discharge medications will be authorized once you are discharged, as it is imperative that you return to your primary care physician (or establish a relationship with a primary care physician if you do not have one) for your aftercare needs so that they can reassess your  need for medications and monitor your lab values.    Today   CHIEF COMPLAINT:   Chief Complaint  Patient presents with  . Abdominal Pain    HISTORY OF PRESENT ILLNESS:  Rachel Brennan  is a 30 y.o. female presented with abdominal pain and was  found to have a kidney stone and a drop in hemoglobin.   VITAL SIGNS:  Blood pressure 105/61, pulse 76, temperature 98.5 F (36.9 C), temperature source Oral, resp. rate 18, height 5\' 1"  (1.549 m), weight 76.386 kg (168 lb 6.4 oz), last menstrual period 02/16/2015, SpO2 100 %.  I/O:   Intake/Output Summary (Last 24 hours) at 03/08/15 1551 Last data filed at 03/08/15 1047  Gross per 24 hour  Intake    240 ml  Output    850 ml  Net   -610 ml    PHYSICAL EXAMINATION:  GENERAL:  30 y.o.-year-old patient lying in the bed with no acute distress.  EYES: Pupils equal, round, reactive to light and accommodation. No scleral icterus. Extraocular muscles intact.  HEENT: Head atraumatic, normocephalic. Oropharynx and nasopharynx clear.  NECK:  Supple, no jugular venous distention. No thyroid enlargement, no tenderness.  LUNGS: Normal breath sounds bilaterally, no wheezing, rales,rhonchi or crepitation. No use of accessory muscles of respiration.  CARDIOVASCULAR: S1, S2 normal. No murmurs, rubs, or gallops.  ABDOMEN: Soft, non-tender, non-distended. Bowel sounds present. No organomegaly or mass.  EXTREMITIES: No pedal edema, cyanosis, or clubbing.  NEUROLOGIC: Cranial nerves II through XII are intact. Muscle strength 5/5 in all extremities. Sensation intact. Gait not checked.  PSYCHIATRIC: The patient is alert and oriented x 3.  SKIN: No obvious rash, lesion, or ulcer.   DATA REVIEW:   CBC  Recent Labs Lab 03/08/15 0601  03/08/15 1020  WBC 9.5  --   --   HGB 11.6*  < > 11.9*  HCT 35.0  --   --   PLT 297  --   --   < > = values in this interval not displayed.  Chemistries   Recent Labs Lab 03/07/15 1508 03/08/15 0601  NA 139 139  K 4.2 4.3  CL 109 105  CO2 23 26  GLUCOSE 120* 109*  BUN 14 12  CREATININE 0.69 0.74  CALCIUM 9.0 8.9  AST 19  --   ALT 13*  --   ALKPHOS 62  --   BILITOT <0.1*  --      Microbiology Results    RADIOLOGY:  Ct Abdomen Pelvis W  Contrast  03/07/2015  CLINICAL DATA:  Acute onset right flank pain and right lower quadrant pain this morning. nausea and vomiting. EXAM: CT ABDOMEN AND PELVIS WITH CONTRAST TECHNIQUE: Multidetector CT imaging of the abdomen and pelvis was performed using the standard protocol following bolus administration of intravenous contrast. CONTRAST:  18mL OMNIPAQUE IOHEXOL 300 MG/ML  SOLN COMPARISON:  None. FINDINGS: Lower chest:  No acute findings. Hepatobiliary: 1.9 cm lesion with peripheral nodular enhancement seen in the inferior right hepatic lobe consistent with a small benign hemangioma. Sub-cm low-attenuation lesion in the medial right hepatic lobe is too small to characterize by CT but is also likely benign. Gallbladder is unremarkable. Pancreas: No mass, inflammatory changes, or other significant abnormality. Spleen: Within normal limits in size and appearance. Adrenals/Urinary Tract: No adrenal masses identified. Tiny 1-2 mm nonobstructive renal calculi seen bilaterally. Mild right hydronephrosis and ureterectasis is seen. A 3 mm distal right ureteral calculus is seen in the upper portion of the pelvis on  image 65/series 2. Stomach/Bowel: No evidence of obstruction, inflammatory process, or abnormal fluid collections. Vascular/Lymphatic: No pathologically enlarged lymph nodes. No evidence of abdominal aortic aneurysm. Reproductive: No mass or other significant abnormality. Other: None. Musculoskeletal:  No suspicious bone lesions identified. IMPRESSION: 3 mm distal right ureteral calculus causing mild right hydronephrosis. Other tiny nonobstructive bilateral intrarenal calculi. Electronically Signed   By: Earle Gell M.D.   On: 03/07/2015 18:04    Management plans discussed with the patient, family and they are in agreement.  CODE STATUS:  Code Status History    Date Active Date Inactive Code Status Order ID Comments User Context   03/07/2015  9:27 PM 03/08/2015  3:29 PM Full Code [id number]  Vaughan Basta, MD Inpatient      TOTAL TIME TAKING CARE OF THIS PATIENT: 32 minutes.    Loletha Grayer M.D on 03/08/2015 at 3:51 PM  Between 7am to 6pm - Pager - [phone]  After 6pm go to [email or web address] - password [email or web address] [facility]  [facility]  Office  [phone]  CC: Primary care physician; No PCP Per Patient

## 2015-03-08 NOTE — Consult Note (Signed)
Reason for Consult: Right ureteral stone Referring Physician: Dr. Hiram Gash Rachel Brennan is an 30 y.o. female.  HPI: This is a 30 year old female admitted yesterday due to by lower quadrant pain, anemia, hematuria. On evaluation with CT scan of the abdomen and pelvis she was transferred to have a right distal ureteral stone. She had a 3 mm stone. She states she's never had prior surgeries for stones and has never had an episode of stones. She endorses some urgency, frequency, dysuria. Denies any fevers at home. Positive nausea. No vomiting recently. Her pain has improved substantially since yesterday.  Past Medical History  Diagnosis Date  . UTI (lower urinary tract infection)     History reviewed. No pertinent past surgical history.  Family History  Problem Relation Age of Onset  . Hypertension Father     Social History:  reports that she has never smoked. She does not have any smokeless tobacco history on file. She reports that she drinks alcohol. She reports that she does not use illicit drugs.  Allergies: No Known Allergies  Medications:  Prior to Admission:  No prescriptions prior to admission    Results for orders placed or performed during the hospital encounter of 03/07/15 (from the past 48 hour(s))  CBC     Status: Abnormal   Collection Time: 03/07/15  2:22 PM  Result Value Ref Range   WBC 7.0 3.6 - 11.0 K/uL   RBC 2.47 (L) 3.80 - 5.20 MIL/uL   Hemoglobin 7.2 (L) 12.0 - 16.0 g/dL   HCT 22.3 (L) 35.0 - 47.0 %   MCV 90.2 80.0 - 100.0 fL   MCH 29.3 26.0 - 34.0 pg   MCHC 32.5 32.0 - 36.0 g/dL   RDW 13.3 11.5 - 14.5 %   Platelets 200 150 - 440 K/uL  Lipase, blood     Status: None   Collection Time: 03/07/15  3:08 PM  Result Value Ref Range   Lipase 18 11 - 51 U/L  Comprehensive metabolic panel     Status: Abnormal   Collection Time: 03/07/15  3:08 PM  Result Value Ref Range   Sodium 139 135 - 145 mmol/L   Potassium 4.2 3.5 - 5.1 mmol/L   Chloride 109 101 -  111 mmol/L   CO2 23 22 - 32 mmol/L   Glucose, Bld 120 (H) 65 - 99 mg/dL   BUN 14 6 - 20 mg/dL   Creatinine, Ser 0.69 0.44 - 1.00 mg/dL   Calcium 9.0 8.9 - 10.3 mg/dL   Total Protein 7.1 6.5 - 8.1 g/dL   Albumin 3.9 3.5 - 5.0 g/dL   AST 19 15 - 41 U/L   ALT 13 (L) 14 - 54 U/L   Alkaline Phosphatase 62 38 - 126 U/L   Total Bilirubin <0.1 (L) 0.3 - 1.2 mg/dL   GFR calc non Af Amer >60 >60 mL/min   GFR calc Af Amer >60 >60 mL/min    Comment: (NOTE) The eGFR has been calculated using the CKD EPI equation. This calculation has not been validated in all clinical situations. eGFR's persistently <60 mL/min signify possible Chronic Kidney Disease.    Anion gap 7 5 - 15  Urinalysis complete, with microscopic ([facility] only)     Status: Abnormal   Collection Time: 03/07/15  4:08 PM  Result Value Ref Range   Color, Urine YELLOW (A) YELLOW   APPearance TURBID (A) CLEAR   Glucose, UA NEGATIVE NEGATIVE mg/dL   Bilirubin Urine NEGATIVE NEGATIVE  Ketones, ur TRACE (A) NEGATIVE mg/dL   Specific Gravity, Urine 1.018 1.005 - 1.030   Hgb urine dipstick 3+ (A) NEGATIVE   pH 8.0 5.0 - 8.0   Protein, ur 100 (A) NEGATIVE mg/dL   Nitrite NEGATIVE NEGATIVE   Leukocytes, UA TRACE (A) NEGATIVE   RBC / HPF TOO NUMEROUS TO COUNT 0 - 5 RBC/hpf   WBC, UA 6-30 0 - 5 WBC/hpf   Bacteria, UA MANY (A) NONE SEEN   Squamous Epithelial / LPF TOO NUMEROUS TO COUNT (A) NONE SEEN   Mucous PRESENT   Pregnancy, urine POC     Status: None   Collection Time: 03/07/15  4:14 PM  Result Value Ref Range   Preg Test, Ur NEGATIVE NEGATIVE    Comment:        THE SENSITIVITY OF THIS METHODOLOGY IS >24 mIU/mL   Basic metabolic panel     Status: Abnormal   Collection Time: 03/08/15  6:01 AM  Result Value Ref Range   Sodium 139 135 - 145 mmol/L   Potassium 4.3 3.5 - 5.1 mmol/L   Chloride 105 101 - 111 mmol/L   CO2 26 22 - 32 mmol/L   Glucose, Bld 109 (H) 65 - 99 mg/dL   BUN 12 6 - 20 mg/dL   Creatinine, Ser 0.74 0.44 -  1.00 mg/dL   Calcium 8.9 8.9 - 10.3 mg/dL   GFR calc non Af Amer >60 >60 mL/min   GFR calc Af Amer >60 >60 mL/min    Comment: (NOTE) The eGFR has been calculated using the CKD EPI equation. This calculation has not been validated in all clinical situations. eGFR's persistently <60 mL/min signify possible Chronic Kidney Disease.    Anion gap 8 5 - 15  Type and screen [facility]     Status: None   Collection Time: 03/08/15  7:50 AM  Result Value Ref Range   ABO/RH(D) A POS    Antibody Screen NEG    Sample Expiration 03/11/2015   Ferritin     Status: None   Collection Time: 03/08/15  7:50 AM  Result Value Ref Range   Ferritin 15 11 - 307 ng/mL  Iron and TIBC     Status: Abnormal   Collection Time: 03/08/15  7:50 AM  Result Value Ref Range   Iron 40 28 - 170 ug/dL   TIBC 403 250 - 450 ug/dL   Saturation Ratios 10 (L) 10.4 - 31.8 %   UIBC 363 ug/dL  ABO/Rh     Status: None   Collection Time: 03/08/15  7:51 AM  Result Value Ref Range   ABO/RH(D) A POS     Ct Abdomen Pelvis W Contrast  03/07/2015  CLINICAL DATA:  Acute onset right flank pain and right lower quadrant pain this morning. nausea and vomiting. EXAM: CT ABDOMEN AND PELVIS WITH CONTRAST TECHNIQUE: Multidetector CT imaging of the abdomen and pelvis was performed using the standard protocol following bolus administration of intravenous contrast. CONTRAST:  178m OMNIPAQUE IOHEXOL 300 MG/ML  SOLN COMPARISON:  None. FINDINGS: Lower chest:  No acute findings. Hepatobiliary: 1.9 cm lesion with peripheral nodular enhancement seen in the inferior right hepatic lobe consistent with a small benign hemangioma. Sub-cm low-attenuation lesion in the medial right hepatic lobe is too small to characterize by CT but is also likely benign. Gallbladder is unremarkable. Pancreas: No mass, inflammatory changes, or other significant abnormality. Spleen: Within normal limits in size and appearance. Adrenals/Urinary Tract: No  adrenal masses  identified. Tiny 1-2 mm nonobstructive renal calculi seen bilaterally. Mild right hydronephrosis and ureterectasis is seen. A 3 mm distal right ureteral calculus is seen in the upper portion of the pelvis on image 65/series 2. Stomach/Bowel: No evidence of obstruction, inflammatory process, or abnormal fluid collections. Vascular/Lymphatic: No pathologically enlarged lymph nodes. No evidence of abdominal aortic aneurysm. Reproductive: No mass or other significant abnormality. Other: None. Musculoskeletal:  No suspicious bone lesions identified. IMPRESSION: 3 mm distal right ureteral calculus causing mild right hydronephrosis. Other tiny nonobstructive bilateral intrarenal calculi. Electronically Signed   By: Earle Gell M.D.   On: 03/07/2015 18:04    Review of Systems  Constitutional: Negative for fever, chills, weight loss and malaise/fatigue.  HENT: Negative for hearing loss and sore throat.   Eyes: Negative for blurred vision and photophobia.  Respiratory: Negative for cough, hemoptysis and sputum production.   Cardiovascular: Negative for chest pain, palpitations and orthopnea.  Gastrointestinal: Positive for nausea and abdominal pain. Negative for heartburn, vomiting, diarrhea and constipation.  Genitourinary: Positive for dysuria, urgency, frequency, hematuria and flank pain.  Musculoskeletal: Negative for myalgias, back pain and neck pain.  Skin: Negative for rash.  Neurological: Negative for dizziness, speech change, seizures, weakness and headaches.  Endo/Heme/Allergies: Negative for environmental allergies. Does not bruise/bleed easily.  Psychiatric/Behavioral: Negative for depression and memory loss.   Blood pressure 105/61, pulse 76, temperature 98.5 F (36.9 C), temperature source Oral, resp. rate 18, height '5\' 1"'  (1.549 m), weight 76.386 kg (168 lb 6.4 oz), last menstrual period 02/16/2015, SpO2 100 %. Physical Exam  Constitutional: She is oriented to person, place,  and time. She appears well-developed and well-nourished.  HENT:  Head: Normocephalic and atraumatic.  Eyes: EOM are normal. Right eye exhibits no discharge. Left eye exhibits no discharge. No scleral icterus.  Neck: Normal range of motion. Neck supple. No JVD present.  Cardiovascular: Normal rate and intact distal pulses.   Respiratory: Effort normal and breath sounds normal. She has no wheezes.  GI: Bowel sounds are normal. She exhibits no distension and no mass. There is no tenderness. There is no rebound and no guarding.  Musculoskeletal: She exhibits no edema.  Neurological: She is alert and oriented to person, place, and time.  Skin: Skin is warm and dry.  Psychiatric: She has a normal mood and affect. Her behavior is normal.  BACK: No CVAT   Assessment/Plan: Right distal, 3 mm ureteral stone.  I discussed with her the etiology of kidney stone disease. We'll discuss her imaging and reviewed it in detail. She has a distal 3 mm ureteral stone which is expected to be able to pass with medical expulsive therapy. We discussed the potential need for interventions including ureteral stenting and lithotripsy if she were to not pass the stone. In the absence of an elevated white blood cell count, worsening renal function, intractable pain, intractable nausea or vomiting I do not see a need to proceed to the operating room at the current time. She is content with proceeding with medical expulsive therapy  - I recommend that we start Flomax 0.4 mg daily for the next 30 days - Encourage hydration with 2 L of fluid per day - Limit sodium intake - Limited red meats - Strain her urine - Will need to follow as an outpatient in 3-4 weeks   Rachel Brennan 03/08/2015, 9:51 AM

## 2015-03-09 ENCOUNTER — Telehealth: Payer: Self-pay

## 2015-03-09 LAB — CBC
HCT: 22.3 % — ABNORMAL LOW (ref 35.0–47.0)
HEMOGLOBIN: 7.2 g/dL — AB (ref 12.0–16.0)
MCH: 29.3 pg (ref 26.0–34.0)
MCHC: 32.5 g/dL (ref 32.0–36.0)
MCV: 90.2 fL (ref 80.0–100.0)
PLATELETS: 200 10*3/uL (ref 150–440)
RBC: 2.47 MIL/uL — AB (ref 3.80–5.20)
RDW: 13.3 % (ref 11.5–14.5)
WBC: 7 10*3/uL (ref 3.6–11.0)

## 2015-03-09 NOTE — Telephone Encounter (Signed)
-----   Message from Dereck Leep, MD sent at 03/08/2015 10:02 AM EST ----- Regarding: Follow up Please schedule Ms. Rachel Brennan for a follow up visit with any provider for her distal ureteral stone  Time Frame 3-4 weeks  Thanks RJM

## 2015-03-09 NOTE — Telephone Encounter (Signed)
done

## 2015-04-06 ENCOUNTER — Ambulatory Visit: Payer: Self-pay | Admitting: Urology

## 2015-04-06 ENCOUNTER — Encounter: Payer: Self-pay | Admitting: Urology

## 2015-10-14 ENCOUNTER — Encounter: Payer: Self-pay | Admitting: Medical Oncology

## 2015-10-14 ENCOUNTER — Emergency Department
Admission: EM | Admit: 2015-10-14 | Discharge: 2015-10-14 | Disposition: A | Discharge status: Home / Self Care | Payer: Medicaid Other | Attending: Emergency Medicine | Admitting: Emergency Medicine

## 2015-10-14 DIAGNOSIS — Z792 Long term (current) use of antibiotics: Secondary | ICD-10-CM | POA: Diagnosis not present

## 2015-10-14 DIAGNOSIS — R1084 Generalized abdominal pain: Secondary | ICD-10-CM | POA: Diagnosis not present

## 2015-10-14 DIAGNOSIS — R197 Diarrhea, unspecified: Secondary | ICD-10-CM | POA: Insufficient documentation

## 2015-10-14 DIAGNOSIS — Z79899 Other long term (current) drug therapy: Secondary | ICD-10-CM | POA: Diagnosis not present

## 2015-10-14 DIAGNOSIS — R112 Nausea with vomiting, unspecified: Secondary | ICD-10-CM | POA: Insufficient documentation

## 2015-10-14 LAB — URINALYSIS COMPLETE WITH MICROSCOPIC (ARMC ONLY)
BACTERIA UA: NONE SEEN
BILIRUBIN URINE: NEGATIVE
GLUCOSE, UA: NEGATIVE mg/dL
Hgb urine dipstick: NEGATIVE
Ketones, ur: NEGATIVE mg/dL
Leukocytes, UA: NEGATIVE
NITRITE: NEGATIVE
Protein, ur: NEGATIVE mg/dL
SPECIFIC GRAVITY, URINE: 1.025 (ref 1.005–1.030)
pH: 6 (ref 5.0–8.0)

## 2015-10-14 LAB — CBC
HCT: 39 % (ref 35.0–47.0)
Hemoglobin: 12.7 g/dL (ref 12.0–16.0)
MCH: 28.8 pg (ref 26.0–34.0)
MCHC: 32.6 g/dL (ref 32.0–36.0)
MCV: 88.4 fL (ref 80.0–100.0)
PLATELETS: 298 10*3/uL (ref 150–440)
RBC: 4.41 MIL/uL (ref 3.80–5.20)
RDW: 13.5 % (ref 11.5–14.5)
WBC: 8.2 10*3/uL (ref 3.6–11.0)

## 2015-10-14 LAB — COMPREHENSIVE METABOLIC PANEL
ALBUMIN: 3.8 g/dL (ref 3.5–5.0)
ALK PHOS: 63 U/L (ref 38–126)
ALT: 11 U/L — ABNORMAL LOW (ref 14–54)
AST: 14 U/L — AB (ref 15–41)
Anion gap: 6 (ref 5–15)
BILIRUBIN TOTAL: 0.3 mg/dL (ref 0.3–1.2)
BUN: 14 mg/dL (ref 6–20)
CALCIUM: 9.2 mg/dL (ref 8.9–10.3)
CO2: 23 mmol/L (ref 22–32)
CREATININE: 0.77 mg/dL (ref 0.44–1.00)
Chloride: 108 mmol/L (ref 101–111)
GFR calc Af Amer: >60 mL/min (ref >60)
GLUCOSE: 101 mg/dL — AB (ref 65–99)
Potassium: 4.2 mmol/L (ref 3.5–5.1)
Sodium: 137 mmol/L (ref 135–145)
TOTAL PROTEIN: 7.3 g/dL (ref 6.5–8.1)

## 2015-10-14 LAB — POCT PREGNANCY, URINE: PREG TEST UR: NEGATIVE

## 2015-10-14 LAB — LIPASE, BLOOD: Lipase: 27 U/L (ref 11–51)

## 2015-10-14 NOTE — ED Provider Notes (Signed)
[facility] Emergency Department Provider Note  ____________________________________________   First MD Initiated Contact with Patient 10/14/15 1436     (approximate)  I have reviewed the triage vital signs and the nursing notes.   HISTORY  Chief Complaint Abdominal Pain; Nausea; Emesis; and Diarrhea   HPI Rachel Brennan is a 30 y.o. female without any chronic medical conditions was presenting to the emergency department today with nausea vomiting and diarrhea. She says that at 2 AM last night she began having vomiting which stopped about 5 and this morning. However, she says that she has had about 8-9 episodes of diarrhea. She says that the diarrhea has "slowed down" at this point. 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 She was first seen at the [facility] clinic and then sent to the emergency department for further evaluation. She's been having diffuse and especially lower abdominal cramping throughout this episode. She says that since 5 AM she is able to tolerate Gatorade without any vomiting. She was concerned about returning to work and not spreading this, if it was a virus. She says that her 9-year-old child had a similar condition last week. She denies any runny nose or ear pressure or fever.   Past Medical History:  Diagnosis Date  . UTI (lower urinary tract infection)     Patient Active Problem List   Diagnosis Date Noted  . Hematuria 03/07/2015  . Hydronephrosis 03/07/2015  . Anemia 03/07/2015    Past Surgical History:  Procedure Laterality Date  . CESAREAN SECTION      Prior to Admission medications   Medication Sig Start Date End Date Taking? Authorizing Provider  cephALEXin (KEFLEX) 500 MG capsule Take 1 capsule (500 mg total) by mouth every 8 (eight) hours. 03/08/15   Loletha Grayer, MD  ferrous sulfate 325 (65 FE) MG tablet Take 1  tablet (325 mg total) by mouth daily with breakfast. 03/09/15   Loletha Grayer, MD  oxyCODONE (OXY IR/ROXICODONE) 5 MG immediate release tablet Take 1 tablet (5 mg total) by mouth every 6 (six) hours as needed for moderate pain. 03/08/15   Loletha Grayer, MD  tamsulosin (FLOMAX) 0.4 MG CAPS capsule Take 1 capsule (0.4 mg total) by mouth daily after supper. 03/08/15   Loletha Grayer, MD    Allergies Review of patient's allergies indicates no known allergies.  Family History  Problem Relation Age of Onset  . Hypertension Father     Social History Social History  Substance Use Topics  . Smoking status: Never Smoker  . Smokeless tobacco: Not on file  . Alcohol use Yes     Comment: twice per month    Review of Systems Constitutional: No fever/chills Eyes: No visual changes. ENT: No sore throat. Cardiovascular: Denies chest pain. Respiratory: Denies shortness of breath. Gastrointestinal:   No constipation. Genitourinary: Negative for dysuria. Musculoskeletal: Negative for back pain. Skin: Negative for rash. Neurological: Negative for headaches, focal weakness or numbness.  10-point ROS otherwise negative.  ____________________________________________   PHYSICAL EXAM:  VITAL SIGNS: ED Triage Vitals  Enc Vitals Group     BP 10/14/15 1337 111/85     Pulse Rate 10/14/15 1337 78     Resp 10/14/15 1337 18     Temp 10/14/15 1337 98.6 F (37 C)     Temp Source 10/14/15 1337 Oral     SpO2 10/14/15 1337 99 %  Weight 10/14/15 1338 174 lb (78.9 kg)     Height 10/14/15 1338 5\' 1"  (1.549 m)     Head Circumference --      Peak Flow --      Pain Score 10/14/15 1338 3     Pain Loc --      Pain Edu? --      Excl. in [place]? --     Constitutional: Alert and oriented. Well appearing and in no acute distress. Eyes: Conjunctivae are normal. PERRL. EOMI. Head: Atraumatic. Nose: No congestion/rhinnorhea. Mouth/Throat: Mucous membranes are moist.   Neck: No stridor.   Cardiovascular:  Normal rate, regular rhythm. Grossly normal heart sounds.   Respiratory: Normal respiratory effort.  No retractions. Lungs CTAB. Gastrointestinal: Soft With mild upper abdominal tenderness as well as left lower quadrant abdominal tenderness which is also mild. No tenderness to the right lower quadrant. Negative Murphy sign. No distention.  Musculoskeletal: No lower extremity tenderness nor edema.  No joint effusions. Neurologic:  Normal speech and language. No gross focal neurologic deficits are appreciated. No gait instability. Skin:  Skin is warm, dry and intact. No rash noted. Psychiatric: Mood and affect are normal. Speech and behavior are normal.  ____________________________________________   LABS (all labs ordered are listed, but only abnormal results are displayed)  Labs Reviewed  COMPREHENSIVE METABOLIC PANEL - Abnormal; Notable for the following:       Result Value   Glucose, Bld 101 (*)    AST 14 (*)    ALT 11 (*)    All other components within normal limits  URINALYSIS COMPLETEWITH MICROSCOPIC ([facility] ONLY) - Abnormal; Notable for the following:    Color, Urine YELLOW (*)    APPearance CLEAR (*)    Squamous Epithelial / LPF 0-5 (*)    All other components within normal limits  LIPASE, BLOOD  CBC  POC URINE PREG, ED  POCT PREGNANCY, URINE   ____________________________________________  EKG   ____________________________________________  RADIOLOGY   ____________________________________________   PROCEDURES  Procedure(s) performed:   Procedures  Critical Care performed:   ____________________________________________   INITIAL IMPRESSION / ASSESSMENT AND PLAN / ED COURSE  Pertinent labs & imaging results that were available during my care of the patient were reviewed by me and considered in my medical decision making (see chart for details).  Patient without any distress at this time. We discussed that she may return to work when she is asymptomatic.  I will be giving her a work note saying this as well. She is understanding of this plan and willing to comply. She'll be discharged home. We also discussed proper hand hygiene to further limit the spread of what is likely a virus causing her symptoms.  Clinical Course     ____________________________________________   FINAL CLINICAL IMPRESSION(S) / ED DIAGNOSES  Abdominal pain. Nausea vomiting and diarrhea.    NEW MEDICATIONS STARTED DURING THIS VISIT:  New Prescriptions   No medications on file     Note:  This document was prepared using Dragon voice recognition software and may include unintentional dictation errors.    Orbie Pyo, MD 10/14/15 [phone]

## 2015-10-14 NOTE — ED Triage Notes (Signed)
Pt reports she woke up this am with nausea vomiting and diarrhea. Reports generalized abd pain to lower abd. Denies fever.

## 2015-10-14 NOTE — ED Notes (Signed)
Pt informed to return if any life threatening symptoms occur.  

## 2015-12-30 ENCOUNTER — Encounter: Payer: Self-pay | Admitting: Emergency Medicine

## 2015-12-30 ENCOUNTER — Emergency Department
Admission: EM | Admit: 2015-12-30 | Discharge: 2015-12-30 | Disposition: A | Discharge status: Home / Self Care | Payer: Medicaid Other | Attending: Emergency Medicine | Admitting: Emergency Medicine

## 2015-12-30 DIAGNOSIS — N939 Abnormal uterine and vaginal bleeding, unspecified: Secondary | ICD-10-CM | POA: Diagnosis not present

## 2015-12-30 DIAGNOSIS — N760 Acute vaginitis: Secondary | ICD-10-CM | POA: Diagnosis not present

## 2015-12-30 DIAGNOSIS — B9689 Other specified bacterial agents as the cause of diseases classified elsewhere: Secondary | ICD-10-CM

## 2015-12-30 LAB — CBC WITH DIFFERENTIAL/PLATELET
Basophils Absolute: 0.1 10*3/uL (ref 0–0.1)
Basophils Relative: 1 %
EOS PCT: 1 %
Eosinophils Absolute: 0.1 10*3/uL (ref 0–0.7)
HEMATOCRIT: 36.9 % (ref 35.0–47.0)
Hemoglobin: 12.2 g/dL (ref 12.0–16.0)
LYMPHS ABS: 2.5 10*3/uL (ref 1.0–3.6)
LYMPHS PCT: 26 %
MCH: 29.1 pg (ref 26.0–34.0)
MCHC: 33 g/dL (ref 32.0–36.0)
MCV: 88.1 fL (ref 80.0–100.0)
MONO ABS: 0.6 10*3/uL (ref 0.2–0.9)
Monocytes Relative: 6 %
NEUTROS ABS: 6.3 10*3/uL (ref 1.4–6.5)
Neutrophils Relative %: 66 %
PLATELETS: 293 10*3/uL (ref 150–440)
RBC: 4.19 MIL/uL (ref 3.80–5.20)
RDW: 13.8 % (ref 11.5–14.5)
WBC: 9.6 10*3/uL (ref 3.6–11.0)

## 2015-12-30 LAB — BASIC METABOLIC PANEL
ANION GAP: 6 (ref 5–15)
BUN: 15 mg/dL (ref 6–20)
CO2: 24 mmol/L (ref 22–32)
Calcium: 9 mg/dL (ref 8.9–10.3)
Chloride: 106 mmol/L (ref 101–111)
Creatinine, Ser: 0.66 mg/dL (ref 0.44–1.00)
GFR calc Af Amer: >60 mL/min (ref >60)
GLUCOSE: 141 mg/dL — AB (ref 65–99)
POTASSIUM: 3.7 mmol/L (ref 3.5–5.1)
Sodium: 136 mmol/L (ref 135–145)

## 2015-12-30 LAB — URINALYSIS, COMPLETE (UACMP) WITH MICROSCOPIC
Bacteria, UA: NONE SEEN
Bilirubin Urine: NEGATIVE
GLUCOSE, UA: 50 mg/dL — AB
HGB URINE DIPSTICK: NEGATIVE
Ketones, ur: NEGATIVE mg/dL
Leukocytes, UA: NEGATIVE
NITRITE: NEGATIVE
PH: 5 (ref 5.0–8.0)
Protein, ur: NEGATIVE mg/dL
SPECIFIC GRAVITY, URINE: 1.02 (ref 1.005–1.030)

## 2015-12-30 LAB — WET PREP, GENITAL
Sperm: NONE SEEN
TRICH WET PREP: NONE SEEN
Yeast Wet Prep HPF POC: NONE SEEN

## 2015-12-30 LAB — CHLAMYDIA/NGC RT PCR (ARMC ONLY)
CHLAMYDIA TR: NOT DETECTED
N GONORRHOEAE: NOT DETECTED

## 2015-12-30 LAB — POCT PREGNANCY, URINE: Preg Test, Ur: NEGATIVE

## 2015-12-30 MED ORDER — METRONIDAZOLE 500 MG PO TABS: 500.0000 mg | ORAL_TABLET | Freq: Two times a day (BID) | ORAL | 0 refills | Status: DC

## 2015-12-30 MED ORDER — NORGESTIM-ETH ESTRAD TRIPHASIC 0.18/0.215/0.25 MG-25 MCG PO TABS: 1.0000 | ORAL_TABLET | Freq: Every day | ORAL | 11 refills | Status: DC

## 2015-12-30 NOTE — ED Provider Notes (Signed)
[facility] Emergency Department Provider Note        Time seen: ----------------------------------------- 2:49 PM on 12/30/2015 -----------------------------------------    I have reviewed the triage vital signs and the nursing notes.   HISTORY  Chief Complaint Vaginal Bleeding    HPI Rachel Brennan is a 30 y.o. female who presents to the ER for vaginal bleeding intimately for the last 2 months. Patient states during the winter months she typically has irregular menses. She states she will have her normal period and then she will have light bleeding in between. She also has some pain in her vagina and an abnormal smell vaginally. She denies any discharge.   Past Medical History:  Diagnosis Date  . UTI (lower urinary tract infection)     Patient Active Problem List   Diagnosis Date Noted  . Hematuria 03/07/2015  . Hydronephrosis 03/07/2015  . Anemia 03/07/2015    Past Surgical History:  Procedure Laterality Date  . CESAREAN SECTION      Allergies Patient has no known allergies.  Social History Social History  Substance Use Topics  . Smoking status: Never Smoker  . Smokeless tobacco: Not on file  . Alcohol use Yes     Comment: twice per month    Review of Systems Constitutional: Negative for fever. Cardiovascular: Negative for chest pain. Respiratory: Negative for shortness of breath. Gastrointestinal: Negative for abdominal pain, vomiting and diarrhea. Genitourinary: Negative for dysuria.positive forpelvic and vaginal pain, vaginal bleeding Musculoskeletal: Negative for back pain. Skin: Negative for rash. Neurological: Negative for headaches, focal weakness or numbness.  10-point ROS otherwise negative.  ____________________________________________   PHYSICAL EXAM:  VITAL SIGNS: ED Triage Vitals  Enc Vitals Group     BP 12/30/15 1356 128/81     Pulse Rate 12/30/15 1356 85     Resp 12/30/15 1356 18     Temp  12/30/15 1356 98.5 F (36.9 C)     Temp Source 12/30/15 1356 Oral     SpO2 12/30/15 1356 99 %     Weight 12/30/15 1354 171 lb (77.6 kg)     Height 12/30/15 1354 5\' 1"  (1.549 m)     Head Circumference --      Peak Flow --      Pain Score --      Pain Loc --      Pain Edu? --      Excl. in [place]? --     Constitutional: Alert and oriented. Well appearing and in no distress. Eyes: Conjunctivae are normal. PERRL. Normal extraocular movements. ENT   Head: Normocephalic and atraumatic.   Nose: No congestion/rhinnorhea.   Mouth/Throat: Mucous membranes are moist.   Neck: No stridor. Cardiovascular: Normal rate, regular rhythm. No murmurs, rubs, or gallops. Respiratory: Normal respiratory effort without tachypnea nor retractions. Breath sounds are clear and equal bilaterally. No wheezes/rales/rhonchi. Gastrointestinal: Soft and nontender. Normal bowel sounds Genitourinary: Musculoskeletal: Nontender with normal range of motion in all extremities. No lower extremity tenderness nor edema. Neurologic:  Normal speech and language. No gross focal neurologic deficits are appreciated.  Skin:  Skin is warm, dry and intact. No rash noted. Psychiatric: Mood and affect are normal. Speech and behavior are normal.  ___________________________________________  ED COURSE:  Pertinent labs & imaging results that were available during my care of the patient were reviewed by me and considered in my medical decision making (see chart for details). Clinical Course   patient presents to the ER in no distress, we will  assess with labs and possibly ultrasound.  Procedures ____________________________________________   LABS (pertinent positives/negatives)  Labs Reviewed  WET PREP, GENITAL - Abnormal; Notable for the following:       Result Value   Clue Cells Wet Prep HPF POC PRESENT (*)    WBC, Wet Prep HPF POC FEW (*)    All other components within normal limits  URINALYSIS, COMPLETE (UACMP)  WITH MICROSCOPIC - Abnormal; Notable for the following:    Color, Urine YELLOW (*)    APPearance HAZY (*)    Glucose, UA 50 (*)    Squamous Epithelial / LPF 6-30 (*)    All other components within normal limits  BASIC METABOLIC PANEL - Abnormal; Notable for the following:    Glucose, Bld 141 (*)    All other components within normal limits  CHLAMYDIA/NGC RT PCR ([facility] ONLY)  CBC WITH DIFFERENTIAL/PLATELET  POC URINE PREG, ED  POCT PREGNANCY, URINE   ____________________________________________  FINAL ASSESSMENT AND PLAN  Pelvic pain, abnormal vaginal bleeding, Bacterial vaginosis  Plan: Patient with labs and imaging as dictated above. She is in no distress, likely has bacterial vaginosis and some hormonal fluctuation. I will place her on low-dose birth control agent as well as Flagyl. She'll be referred to GYN for outpatient follow-up.   Earleen Newport, MD   Note: This dictation was prepared with Dragon dictation. Any transcriptional errors that result from this process are unintentional    Earleen Newport, MD 12/30/15 [phone]

## 2015-12-30 NOTE — ED Triage Notes (Signed)
Pt would like doctor to know there is a funny smell down there too sometimes.

## 2015-12-30 NOTE — ED Notes (Signed)
ED Provider at bedside. 

## 2015-12-30 NOTE — ED Notes (Signed)
Pt c/o intermit vaginal bleeding. States around winter every year she has increased bleeding. Pt denies any pain at this time but states intermit pain in her uterus.

## 2015-12-30 NOTE — ED Triage Notes (Signed)
Pt reports 4 episodes of vaginal bleeding over last 2 months. Her normal period and then will have light bleeding in between. Has had pain in vagina. Denies vomiting.

## 2016-02-06 ENCOUNTER — Emergency Department
Admission: EM | Admit: 2016-02-06 | Discharge: 2016-02-06 | Disposition: A | Discharge status: Home / Self Care | Payer: Medicaid Other | Attending: Emergency Medicine | Admitting: Emergency Medicine

## 2016-02-06 ENCOUNTER — Encounter: Payer: Self-pay | Admitting: Emergency Medicine

## 2016-02-06 DIAGNOSIS — J069 Acute upper respiratory infection, unspecified: Secondary | ICD-10-CM | POA: Diagnosis not present

## 2016-02-06 DIAGNOSIS — H6993 Unspecified Eustachian tube disorder, bilateral: Secondary | ICD-10-CM | POA: Insufficient documentation

## 2016-02-06 DIAGNOSIS — H6983 Other specified disorders of Eustachian tube, bilateral: Secondary | ICD-10-CM

## 2016-02-06 DIAGNOSIS — R42 Dizziness and giddiness: Secondary | ICD-10-CM | POA: Diagnosis present

## 2016-02-06 MED ORDER — PSEUDOEPH-BROMPHEN-DM 30-2-10 MG/5ML PO SYRP: 10.0000 mL | ORAL_SOLUTION | Freq: Four times a day (QID) | ORAL | 0 refills | Status: DC | PRN

## 2016-02-06 MED ORDER — FLUTICASONE PROPIONATE 50 MCG/ACT NA SUSP: 2.0000 | Freq: Every day | NASAL | 0 refills | Status: DC

## 2016-02-06 NOTE — ED Notes (Signed)

## 2016-02-06 NOTE — ED Triage Notes (Signed)
Earache, dizziness, bodyaches since yesterday.

## 2016-02-06 NOTE — ED Provider Notes (Signed)
[facility] Emergency Department Provider Note  ____________________________________________  Time seen: Approximately 1:36 PM  I have reviewed the triage vital signs and the nursing notes.   HISTORY  Chief Complaint Otalgia and Dizziness    HPI Rachel Brennan is a 31 y.o. female , NAD, presents to the emergency department for one-day history of nasal congestion, ear pressure and dizziness. Patient states she began to feel unwell yesterday. Has had increasing nasal congestion, runny nose and bilateral ear pressure. Had some mild upper body aches today as well as has had increasing postnasal drainage. States that when she sits up from lying down or stands quickly she can feel lightheaded. Has taken over-the-counter cold and flu medication which seems to help with her symptoms. Has had no fevers, chills. Denies chest pain, shortness breath or wheezing. Has had no abdominal pain, nausea or vomiting.Denies changes in urinary or bowel habits.   Past Medical History:  Diagnosis Date  . UTI (lower urinary tract infection)     Patient Active Problem List   Diagnosis Date Noted  . Hematuria 03/07/2015  . Hydronephrosis 03/07/2015  . Anemia 03/07/2015    Past Surgical History:  Procedure Laterality Date  . CESAREAN SECTION      Prior to Admission medications   Medication Sig Start Date End Date Taking? Authorizing Provider  brompheniramine-pseudoephedrine-DM 30-2-10 MG/5ML syrup Take 10 mLs by mouth 4 (four) times daily as needed. 02/06/16   Olive Motyka L Tika Hannis, PA-C  cephALEXin (KEFLEX) 500 MG capsule Take 1 capsule (500 mg total) by mouth every 8 (eight) hours. 03/08/15   Loletha Grayer, MD  ferrous sulfate 325 (65 FE) MG tablet Take 1 tablet (325 mg total) by mouth daily with breakfast. 03/09/15   Loletha Grayer, MD  fluticasone [facility]) 50 MCG/ACT nasal spray Place 2 sprays into both nostrils daily. 02/06/16   Arti Trang L Arianny Pun, PA-C  metroNIDAZOLE (FLAGYL) 500 MG  tablet Take 1 tablet (500 mg total) by mouth 2 (two) times daily. 12/30/15   Earleen Newport, MD  Norgestimate-Ethinyl Estradiol Triphasic (ORTHO TRI-CYCLEN LO) 0.18/0.215/0.25 MG-25 MCG tab Take 1 tablet by mouth daily. 12/30/15   Earleen Newport, MD  oxyCODONE (OXY IR/ROXICODONE) 5 MG immediate release tablet Take 1 tablet (5 mg total) by mouth every 6 (six) hours as needed for moderate pain. 03/08/15   Loletha Grayer, MD  tamsulosin (FLOMAX) 0.4 MG CAPS capsule Take 1 capsule (0.4 mg total) by mouth daily after supper. 03/08/15   Loletha Grayer, MD    Allergies Patient has no known allergies.  Family History  Problem Relation Age of Onset  . Hypertension Father     Social History Social History  Substance Use Topics  . Smoking status: Never Smoker  . Smokeless tobacco: Not on file  . Alcohol use Yes     Comment: twice per month     Review of Systems  Constitutional: No fever/chills, Fatigue Eyes: No visual changes.  ENT: Positive nasal congestion, runny nose, ear pressure and postnasal drainage. No sore throat. Cardiovascular: No chest pain. Respiratory: No cough, chest congestion. No shortness of breath. No wheezing.  Gastrointestinal: No abdominal pain.  No nausea, vomiting.  No diarrhea.  No constipation. Genitourinary: Negative for dysuria. No hematuria. No urinary hesitancy, urgency or increased frequency. Musculoskeletal: Positive for general myalgias.  Skin: Negative for rash. Neurological: Positive lightheadedness. Negative for headaches, focal weakness or numbness. 10-point ROS otherwise negative.  ____________________________________________   PHYSICAL EXAM:  VITAL SIGNS: ED Triage Vitals  Enc Vitals Group     BP 02/06/16 1259 117/79     Pulse Rate 02/06/16 1259 80     Resp 02/06/16 1259 18     Temp 02/06/16 1259 98.6 F (37 C)     Temp Source 02/06/16 1259 Oral     SpO2 02/06/16 1259 99 %     Weight 02/06/16 1259 175 lb (79.4 kg)     Height  02/06/16 1259 5\' 1"  (1.549 m)     Head Circumference --      Peak Flow --      Pain Score 02/06/16 1300 4     Pain Loc --      Pain Edu? --      Excl. in [place]? --      Constitutional: Alert and oriented. Well appearing and in no acute distress. Eyes: Conjunctivae are normal. PERRL. EOMI without pain.  Head: Atraumatic. ENT:      Ears: Bilateral TMs visualized with mild bulging, trace serous effusion but no erythema or perforation.      Nose: Congestion with clear rhinorrhea. Bilateral turbinates are injected.      Mouth/Throat: Mucous membranes are moist. Pharynx without erythema, swelling, exudate. Uvula is midline. Airway is patent. Clear postnasal drainage. Neck: No stridor. Supple with full range of motion. Hematological/Lymphatic/Immunilogical: No cervical lymphadenopathy. Cardiovascular: Normal rate, regular rhythm. Normal S1 and S2.  Good peripheral circulation. Respiratory: Normal respiratory effort without tachypnea or retractions. Lungs CTAB with breath sounds noted in all lung fields. No wheeze, rhonchi, rales. Neurologic:  Normal speech and language. No gross focal neurologic deficits are appreciated.  Skin:  Skin is warm, dry and intact. No rash noted. Psychiatric: Mood and affect are normal. Speech and behavior are normal. Patient exhibits appropriate insight and judgement.   ____________________________________________   LABS  None ____________________________________________  EKG  None ____________________________________________  RADIOLOGY  None ____________________________________________    PROCEDURES  Procedure(s) performed: None   Procedures   Medications - No data to display   ____________________________________________   INITIAL IMPRESSION / ASSESSMENT AND PLAN / ED COURSE  Pertinent labs & imaging results that were available during my care of the patient were reviewed by me and considered in my medical decision making (see chart for  details).     Patient's diagnosis is consistent with viral URI and eustachian tube dysfunction. Patient will be discharged home with prescriptions for Bromfed-DM and Flonase to take as directed. May continue over-the-counter Tylenol or ibuprofen as needed. Patient is to follow up with [facility] if symptoms persist past this treatment course. Patient is given ED precautions to return to the ED for any worsening or new symptoms.   ____________________________________________  FINAL CLINICAL IMPRESSION(S) / ED DIAGNOSES  Final diagnoses:  Viral URI  Dysfunction of both eustachian tubes      NEW MEDICATIONS STARTED DURING THIS VISIT:  Discharge Medication List as of 02/06/2016  1:45 PM    START taking these medications   Details  brompheniramine-pseudoephedrine-DM 30-2-10 MG/5ML syrup Take 10 mLs by mouth 4 (four) times daily as needed., Starting Sat 02/06/2016, Print    fluticasone (FLONASE) 50 MCG/ACT nasal spray Place 2 sprays into both nostrils daily., Starting Sat 02/06/2016, Print             Judithe Modest [place], PA-C 02/06/16 Bradford Quigley, MD 02/06/16 1431

## 2017-12-08 ENCOUNTER — Encounter: Payer: Self-pay | Admitting: Emergency Medicine

## 2017-12-08 ENCOUNTER — Emergency Department: Payer: Medicaid Other

## 2017-12-08 DIAGNOSIS — Y999 Unspecified external cause status: Secondary | ICD-10-CM | POA: Diagnosis not present

## 2017-12-08 DIAGNOSIS — Y939 Activity, unspecified: Secondary | ICD-10-CM | POA: Insufficient documentation

## 2017-12-08 DIAGNOSIS — Z87891 Personal history of nicotine dependence: Secondary | ICD-10-CM | POA: Insufficient documentation

## 2017-12-08 DIAGNOSIS — S61213A Laceration without foreign body of left middle finger without damage to nail, initial encounter: Secondary | ICD-10-CM | POA: Insufficient documentation

## 2017-12-08 DIAGNOSIS — W25XXXA Contact with sharp glass, initial encounter: Secondary | ICD-10-CM | POA: Diagnosis not present

## 2017-12-08 DIAGNOSIS — Z23 Encounter for immunization: Secondary | ICD-10-CM | POA: Diagnosis not present

## 2017-12-08 DIAGNOSIS — S9032XA Contusion of left foot, initial encounter: Secondary | ICD-10-CM | POA: Insufficient documentation

## 2017-12-08 DIAGNOSIS — S99922A Unspecified injury of left foot, initial encounter: Secondary | ICD-10-CM | POA: Diagnosis present

## 2017-12-08 DIAGNOSIS — Z79899 Other long term (current) drug therapy: Secondary | ICD-10-CM | POA: Diagnosis not present

## 2017-12-08 DIAGNOSIS — S91319A Laceration without foreign body, unspecified foot, initial encounter: Secondary | ICD-10-CM | POA: Insufficient documentation

## 2017-12-08 DIAGNOSIS — Y929 Unspecified place or not applicable: Secondary | ICD-10-CM | POA: Diagnosis not present
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
## 2017-12-09 ENCOUNTER — Other Ambulatory Visit: Payer: Self-pay

## 2017-12-09 ENCOUNTER — Emergency Department
Admission: EM | Admit: 2017-12-09 | Discharge: 2017-12-09 | Disposition: A | Discharge status: Home / Self Care | Payer: Medicaid Other | Attending: Emergency Medicine | Admitting: Emergency Medicine

## 2017-12-09 DIAGNOSIS — S9032XA Contusion of left foot, initial encounter: Secondary | ICD-10-CM

## 2017-12-09 DIAGNOSIS — S91312A Laceration without foreign body, left foot, initial encounter: Secondary | ICD-10-CM

## 2017-12-09 DIAGNOSIS — S61213A Laceration without foreign body of left middle finger without damage to nail, initial encounter: Secondary | ICD-10-CM

## 2017-12-09 MED ORDER — TETANUS-DIPHTH-ACELL PERTUSSIS 5-2.5-18.5 LF-MCG/0.5 IM SUSP
0.5000 mL | Freq: Once | INTRAMUSCULAR | Status: AC
  Administered 2017-12-09: 0.5 mL via INTRAMUSCULAR
  Filled 2017-12-09: qty 0.5

## 2017-12-09 MED ORDER — BACITRACIN ZINC 500 UNIT/GM EX OINT
TOPICAL_OINTMENT | CUTANEOUS | Status: AC
  Administered 2017-12-09: 05:00:00 via TOPICAL
  Filled 2017-12-09: qty 0.9

## 2017-12-09 MED ORDER — LIDOCAINE HCL (PF) 1 % IJ SOLN
5.0000 mL | Freq: Once | INTRAMUSCULAR | Status: AC
  Administered 2017-12-09: 5 mL
  Filled 2017-12-09: qty 5

## 2017-12-09 MED ORDER — CEPHALEXIN 500 MG PO CAPS
500.0000 mg | ORAL_CAPSULE | Freq: Once | ORAL | Status: AC
  Administered 2017-12-09: 500 mg via ORAL
  Filled 2017-12-09: qty 1

## 2017-12-09 MED ORDER — CEPHALEXIN 500 MG PO CAPS: 500.0000 mg | ORAL_CAPSULE | Freq: Three times a day (TID) | ORAL | 0 refills | Status: AC
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
## 2017-12-28 IMAGING — CT CT ABD-PELV W/ CM
1 of 2 series · 15 of 32 positions shown, 19 images · IV contrast (omnipaque)
Comparison: None.

CLINICAL DATA: Acute onset right flank pain and right lower
quadrant pain this morning. nausea and vomiting.

EXAM:
CT ABDOMEN AND PELVIS WITH CONTRAST
TECHNIQUE: Multidetector CT imaging of the abdomen and pelvis was performed
using the standard protocol following bolus administration of
intravenous contrast.
CONTRAST:  100mL OMNIPAQUE IOHEXOL 300 MG/ML  SOLN

[Series 2: routine abd pel with · axial · 0.70mm/px · z∈[-488,-74]mm · 15 of 91 slices shown, 19 images]
[im 4/91  soft-tissue]
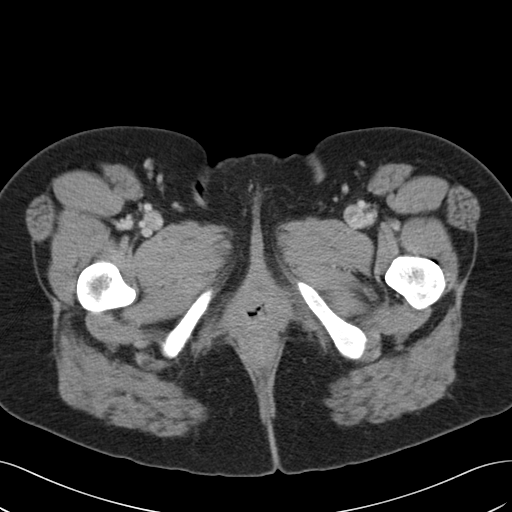
[im 4/91  bone]
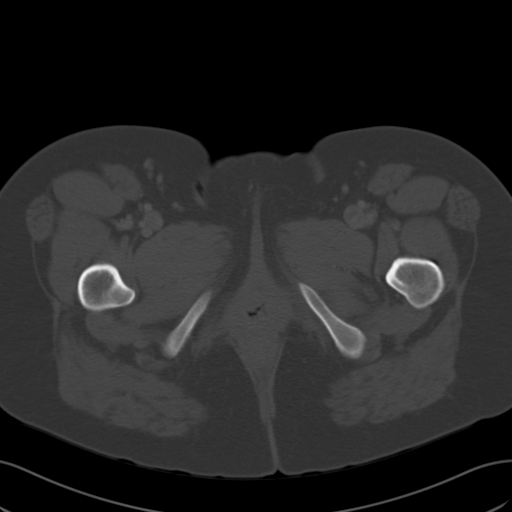
[im 12/91  soft-tissue]
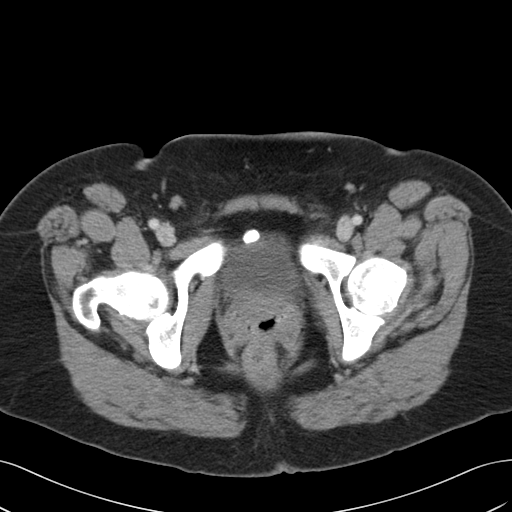
[im 19/91  soft-tissue]
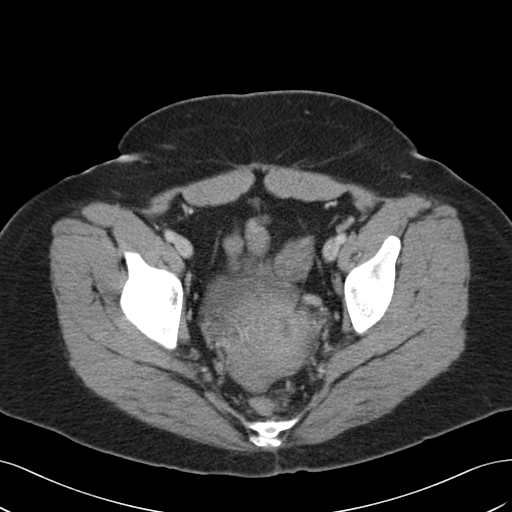
[im 27/91  soft-tissue]
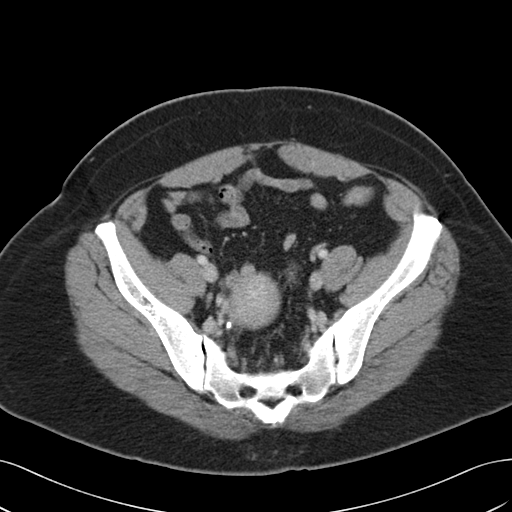
[im 31/91  soft-tissue]
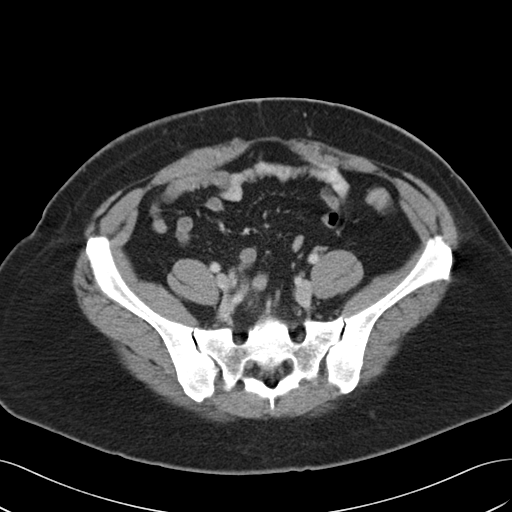
[im 38/91  soft-tissue]
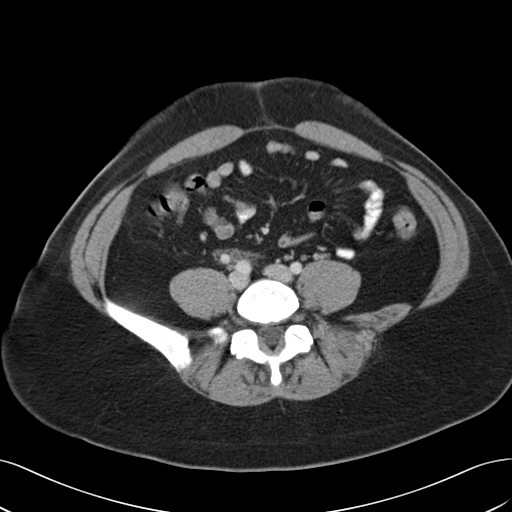
[im 46/91  soft-tissue]
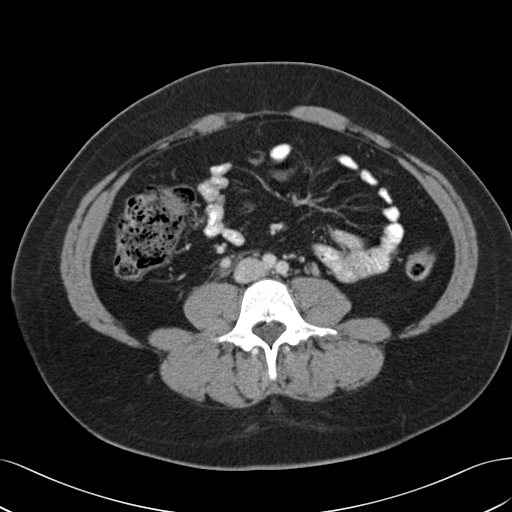
[im 53/91  soft-tissue]
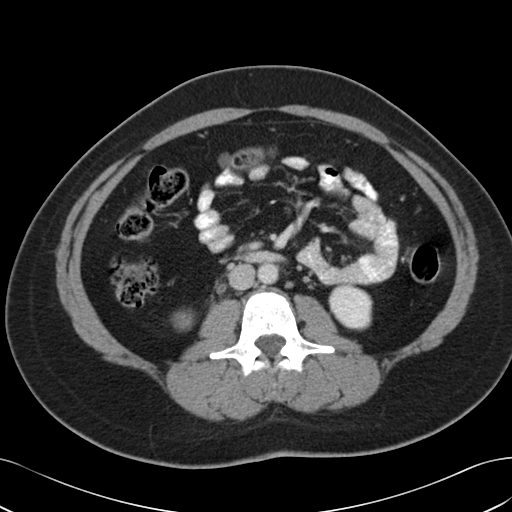
[im 61/91  soft-tissue]
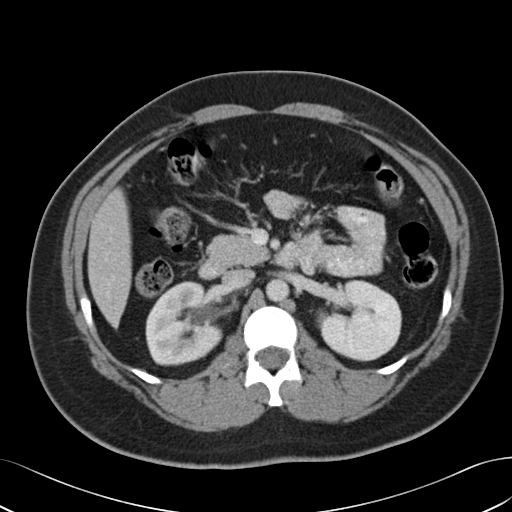
[im 61/91  bone]
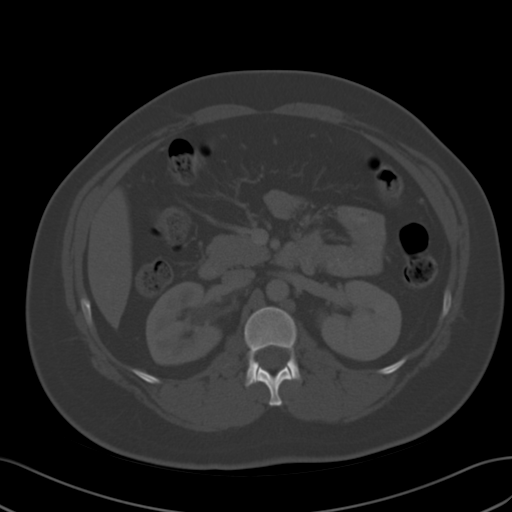
[im 64/91  soft-tissue]
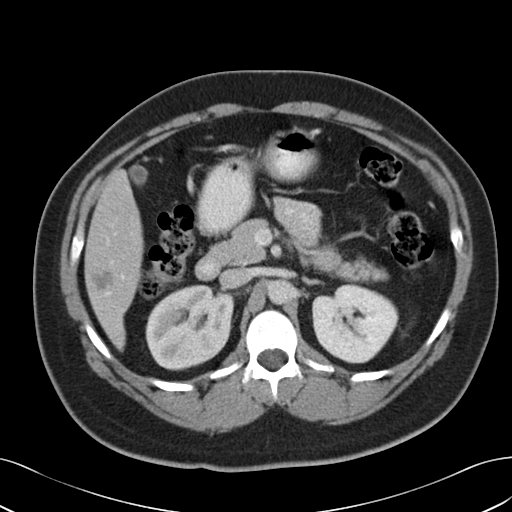
[im 72/91  soft-tissue]
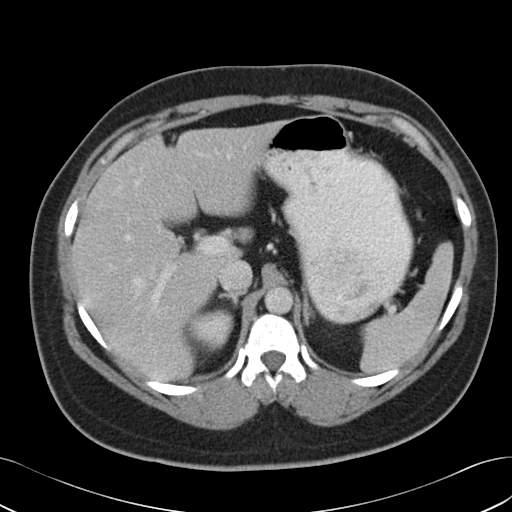
[im 76/91  lung]
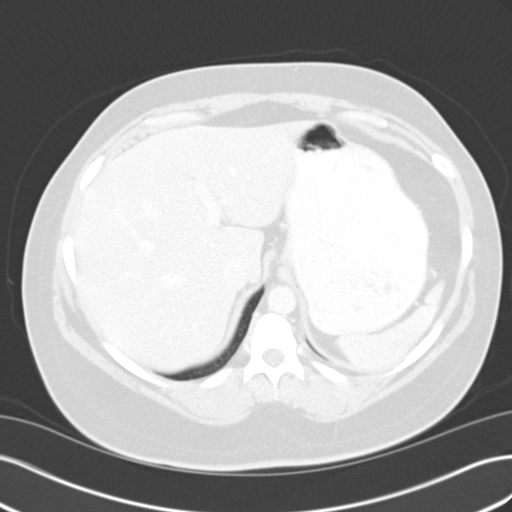
[im 79/91  soft-tissue]
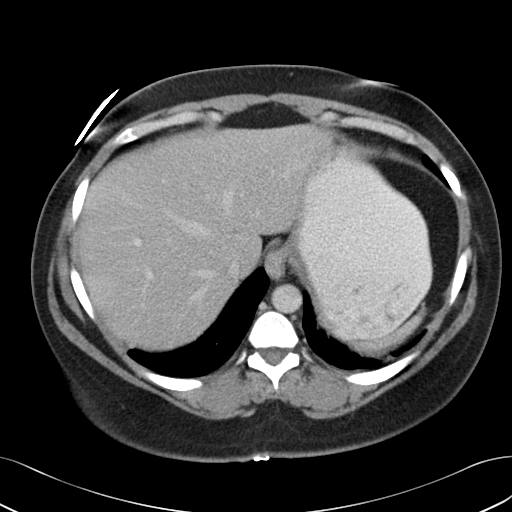
[im 79/91  lung]
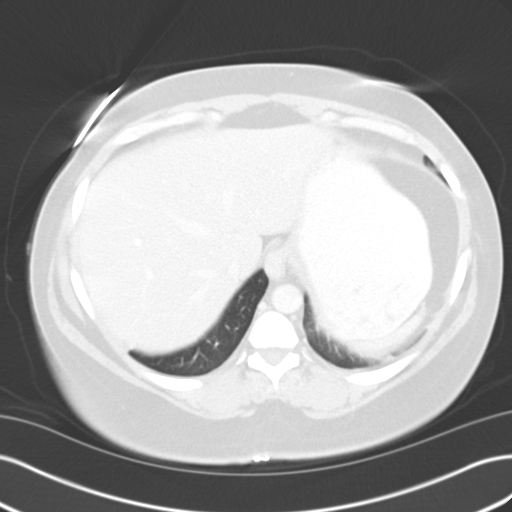
[im 83/91  lung]
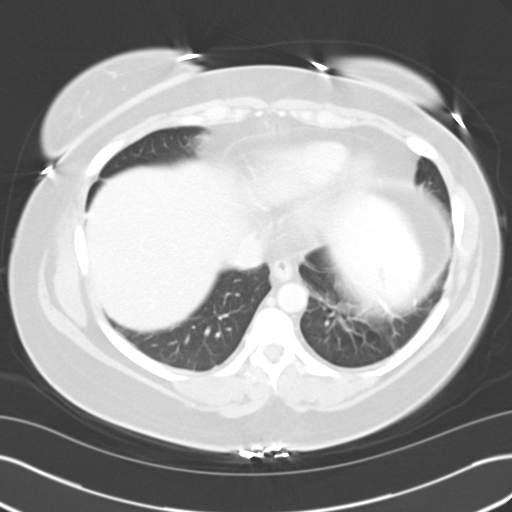
[im 87/91  soft-tissue]
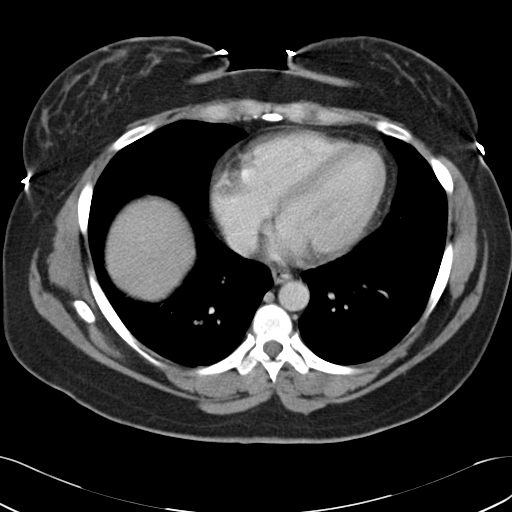
[im 87/91  lung]
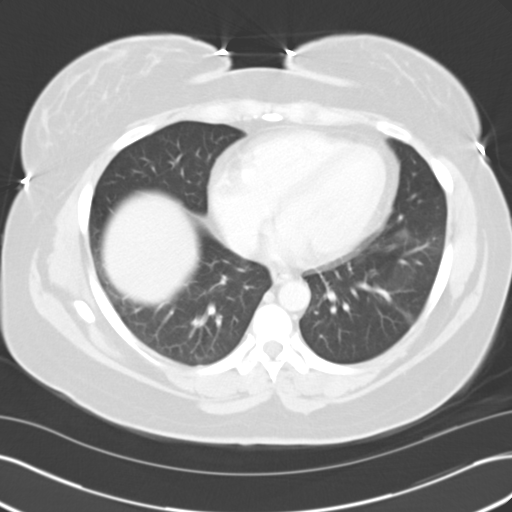

[15 of 32 positions shown; findings below may reference images not displayed]

FINDINGS: Lower chest:  No acute findings.

Hepatobiliary: 1.9 cm lesion with peripheral nodular enhancement
seen in the inferior right hepatic lobe consistent with a small
benign hemangioma. Sub-cm low-attenuation lesion in the medial right
hepatic lobe is too small to characterize by CT but is also likely
benign. Gallbladder is unremarkable.

Pancreas: No mass, inflammatory changes, or other significant
abnormality.

Spleen: Within normal limits in size and appearance.

Adrenals/Urinary Tract: No adrenal masses identified. Tiny 1-2 mm
nonobstructive renal calculi seen bilaterally. Mild right
hydronephrosis and ureterectasis is seen. A 3 mm distal right
ureteral calculus is seen in the upper portion of the pelvis on
image 65/series 2.

Stomach/Bowel: No evidence of obstruction, inflammatory process, or
abnormal fluid collections.

Vascular/Lymphatic: No pathologically enlarged lymph nodes. No
evidence of abdominal aortic aneurysm.

Reproductive: No mass or other significant abnormality.

Other: None.

Musculoskeletal:  No suspicious bone lesions identified.
IMPRESSION: 3 mm distal right ureteral calculus causing mild right
hydronephrosis.

Other tiny nonobstructive bilateral intrarenal calculi.

## 2018-03-02 ENCOUNTER — Ambulatory Visit (INDEPENDENT_AMBULATORY_CARE_PROVIDER_SITE_OTHER): Payer: Medicaid Other | Admitting: Obstetrics and Gynecology

## 2018-03-02 ENCOUNTER — Other Ambulatory Visit: Payer: Self-pay | Admitting: Obstetrics and Gynecology

## 2018-03-02 ENCOUNTER — Encounter: Payer: Self-pay | Admitting: Obstetrics and Gynecology

## 2018-03-02 VITALS — BP 122/84 | Ht 61.0 in | Wt 184.0 lb

## 2018-03-02 DIAGNOSIS — N926 Irregular menstruation, unspecified: Secondary | ICD-10-CM

## 2018-03-02 DIAGNOSIS — D250 Submucous leiomyoma of uterus: Secondary | ICD-10-CM

## 2018-03-02 MED ORDER — NORELGESTROMIN-ETH ESTRADIOL 150-35 MCG/24HR TD PTWK: 1.0000 | MEDICATED_PATCH | TRANSDERMAL | 12 refills | Status: DC

## 2018-03-02 NOTE — Progress Notes (Signed)
Patient ID: Rachel Brennan, female   DOB: 1985/02/13, 33 y.o.   MRN: [id number]  Reason for Consult: Menstrual Problem (Having 2 or 3 periods a month )   Referred by [facility]  Subjective:     HPI:  Rachel Brennan is a 33 y.o. female . She presents today with complaints of prolonged irregular bleeding. She reports that since January she has had two periods a month with over a week of bleeding each. She  Has been having accidents where after sitting for a long time or laying down for a long time when she stands she gushes bright red blood. She is passing golf ball size clots. She has had to change her pads frequently about every 2-3 hours. She has had some dizziness.   GYN History:  She denies history of uterine fibroids, uterine polyps, and abnormal pap smears. She reports her last pap smear was performed at the [facility] in March of 2019.  She reports a remote history of chlamydia and trichomoniasis  She has used Depo for about one year, it caused weight gain and she was not happy with this form of contraception. She has used OCP for about 2-3 months, she was not happy with this form of contraception. She has used an IUD for about 4 years, it caused recurrent BV and vaginal odor and she was not happy with this form of contraception. She would not consider an IUD again in the future.   OB History:  2009 FT Cesarean delivery- Female, no complications  Past Medical History:  Diagnosis Date  . UTI (lower urinary tract infection)    Family History  Problem Relation Age of Onset  . Hypertension Father    Past Surgical History:  Procedure Laterality Date  . CESAREAN SECTION    . LIPOSUCTION      Short Social History:  Social History   Tobacco Use  . Smoking status: Former Research scientist (life sciences)  . Smokeless tobacco: Never Used  Substance Use Topics  . Alcohol use: Yes    Comment: twice per month    No Known Allergies  Current Outpatient Medications  Medication Sig  Dispense Refill  . norelgestromin-ethinyl estradiol Marilu Favre) 150-35 MCG/24HR transdermal patch Place 1 patch onto the skin once a week. 3 patch 12   No current facility-administered medications for this visit.     Review of Systems  Constitutional: Negative for chills, fatigue, fever and unexpected weight change.  HENT: Negative for trouble swallowing.  Eyes: Negative for loss of vision.  Respiratory: Negative for cough, shortness of breath and wheezing.  Cardiovascular: Negative for chest pain, leg swelling, palpitations and syncope.  GI: Negative for abdominal pain, blood in stool, diarrhea, nausea and vomiting.  GU: Negative for difficulty urinating, dysuria, frequency and hematuria.  Musculoskeletal: Negative for back pain, leg pain and joint pain.  Skin: Negative for rash.  Neurological: Negative for dizziness, headaches, light-headedness, numbness and seizures.  Psychiatric: Negative for behavioral problem, confusion, depressed mood and sleep disturbance.        Objective:  Objective   Vitals:   03/02/18 1529  BP: 122/84  Weight: 184 lb (83.5 kg)  Height: 5\' 1"  (1.549 m)   Body mass index is 34.77 kg/m.  Physical Exam Vitals signs and nursing note reviewed. Exam conducted with a chaperone present.  Constitutional:      Appearance: She is well-developed.  HENT:     Head: Normocephalic and atraumatic.  Eyes:     Pupils: Pupils  are equal, round, and reactive to light.  Cardiovascular:     Rate and Rhythm: Normal rate and regular rhythm.  Pulmonary:     Effort: Pulmonary effort is normal. No respiratory distress.  Genitourinary:    General: Normal vulva.     Vagina: Normal.     Cervix: Cervical bleeding present. No cervical motion tenderness.     Uterus: Enlarged. Not fixed and not tender.      Adnexa: Right adnexa normal and left adnexa normal.       Right: No mass, tenderness or fullness.         Left: No mass, tenderness or fullness.       Comments: Enlarged  irregular fibroid uterus, 13-15 cm in size.  Skin:    General: Skin is warm and dry.  Neurological:     Mental Status: She is alert and oriented to person, place, and time.  Psychiatric:        Behavior: Behavior normal.        Thought Content: Thought content normal.        Judgment: Judgment normal.        Assessment/Plan:     33 yo with irregular bleeding, likely secondary to and enlarged fibroid uterus She will return next week for an [place]. Will obtain TSH and check CBC.  Discussed treatment options for a fibroid uterus including medical management, myomectomy, hysteroscopic resection of fibroids, laparoscopic [place] ablation, [place], and hysterectomy. She would like to start medical management with xulane at this time.  More than 45 minutes were spent face to face with the patient in the room with more than 50% of the time spent providing counseling and discussing the plan of management.   Adrian Prows MD [facility] 03/02/2018 4:29 PM

## 2018-03-03 LAB — TSH+FREE T4
Free T4: 1.23 ng/dL (ref 0.82–1.77)
TSH: 1.82 u[IU]/mL (ref 0.450–4.500)

## 2018-03-03 LAB — CBC
HEMOGLOBIN: 11.2 g/dL (ref 11.1–15.9)
Hematocrit: 32.4 % — ABNORMAL LOW (ref 34.0–46.6)
MCH: 28.8 pg (ref 26.6–33.0)
MCHC: 34.6 g/dL (ref 31.5–35.7)
MCV: 83 fL (ref 79–97)
Platelets: 387 10*3/uL (ref 150–450)
RBC: 3.89 x10E6/uL (ref 3.77–5.28)
RDW: 13.2 % (ref 11.7–15.4)
WBC: 8.2 10*3/uL (ref 3.4–10.8)

## 2018-03-13 ENCOUNTER — Ambulatory Visit (INDEPENDENT_AMBULATORY_CARE_PROVIDER_SITE_OTHER): Payer: Self-pay

## 2018-03-13 ENCOUNTER — Encounter: Payer: Self-pay | Admitting: Obstetrics and Gynecology

## 2018-03-13 ENCOUNTER — Ambulatory Visit: Payer: Medicaid Other | Admitting: Obstetrics and Gynecology

## 2018-03-13 VITALS — BP 120/82 | HR 76 | Ht 61.0 in | Wt 182.0 lb

## 2018-03-13 DIAGNOSIS — D252 Subserosal leiomyoma of uterus: Secondary | ICD-10-CM

## 2018-03-13 DIAGNOSIS — E282 Polycystic ovarian syndrome: Secondary | ICD-10-CM | POA: Insufficient documentation

## 2018-03-13 DIAGNOSIS — N926 Irregular menstruation, unspecified: Secondary | ICD-10-CM

## 2018-03-13 DIAGNOSIS — Z13220 Encounter for screening for lipoid disorders: Secondary | ICD-10-CM

## 2018-03-13 DIAGNOSIS — D250 Submucous leiomyoma of uterus: Secondary | ICD-10-CM

## 2018-03-13 DIAGNOSIS — D251 Intramural leiomyoma of uterus: Secondary | ICD-10-CM

## 2018-03-13 DIAGNOSIS — N8301 Follicular cyst of right ovary: Secondary | ICD-10-CM

## 2018-03-13 DIAGNOSIS — Z131 Encounter for screening for diabetes mellitus: Secondary | ICD-10-CM

## 2018-03-13 DIAGNOSIS — N8302 Follicular cyst of left ovary: Secondary | ICD-10-CM

## 2018-03-13 NOTE — Patient Instructions (Signed)

## 2018-03-13 NOTE — Progress Notes (Signed)
Patient ID: Rachel Brennan, female   DOB: Nov 12, 1985, 33 y.o.   MRN: [id number]  Reason for Consult: Follow-up (U/S follow up )   Referred by [facility]  Subjective:     HPI:  Rachel Brennan is a 33 y.o. female  She is following up today for irregular heavy menstrual bleeding. She recently started a birth control patch. She had a gradual improvement and now a resolution of her menses. She is happy with the patch.    Past Medical History:  Diagnosis Date  . UTI (lower urinary tract infection)    Family History  Problem Relation Age of Onset  . Hypertension Father    Past Surgical History:  Procedure Laterality Date  . CESAREAN SECTION    . LIPOSUCTION      Short Social History:  Social History   Tobacco Use  . Smoking status: Former Research scientist (life sciences)  . Smokeless tobacco: Never Used  Substance Use Topics  . Alcohol use: Yes    Comment: twice per month    No Known Allergies  Current Outpatient Medications  Medication Sig Dispense Refill  . norelgestromin-ethinyl estradiol Marilu Favre) 150-35 MCG/24HR transdermal patch Place 1 patch onto the skin once a week. 3 patch 12   No current facility-administered medications for this visit.     Review of Systems  Constitutional: Negative for chills, fatigue, fever and unexpected weight change.  HENT: Negative for trouble swallowing.  Eyes: Negative for loss of vision.  Respiratory: Negative for cough, shortness of breath and wheezing.  Cardiovascular: Negative for chest pain, leg swelling, palpitations and syncope.  GI: Negative for abdominal pain, blood in stool, diarrhea, nausea and vomiting.  GU: Negative for difficulty urinating, dysuria, frequency and hematuria.  Musculoskeletal: Negative for back pain, leg pain and joint pain.  Skin: Negative for rash.  Neurological: Negative for dizziness, headaches, light-headedness, numbness and seizures.  Psychiatric: Negative for behavioral problem, confusion,  depressed mood and sleep disturbance.        Objective:  Objective   Vitals:   03/13/18 1556  BP: 120/82  Pulse: 76  Weight: 182 lb (82.6 kg)  Height: 5\' 1"  (1.549 m)   Body mass index is 34.39 kg/m.  Physical Exam Vitals signs and nursing note reviewed.  Constitutional:      Appearance: She is well-developed.  HENT:     Head: Normocephalic and atraumatic.  Eyes:     Pupils: Pupils are equal, round, and reactive to light.  Cardiovascular:     Rate and Rhythm: Normal rate and regular rhythm.  Pulmonary:     Effort: Pulmonary effort is normal. No respiratory distress.  Skin:    General: Skin is warm and dry.  Neurological:     Mental Status: She is alert and oriented to person, place, and time.  Psychiatric:        Behavior: Behavior normal.        Thought Content: Thought content normal.        Judgment: Judgment normal.         Assessment/Plan:     33 yo with irregular menstrual bleeding. Found to have an enlarged fibroid uterus and characteristic ovarian appearance suggestive of PCOS.   Discussed management options of a fibroid uterus such as birth control, myomectomy, Myosure (would need SIS or endosee to evaluate if this would be an effective option, fibroids do not appear to be significantly submucosal) uterine fibroid embolization, and hysterectomy can be a consideration. Patient currently in school.  Would like to continue at this time with the patch. She is happy with it so far. Will follow up if problems arise.   Discussed PCOS risks and comorbidities.  Will order labs for possible PCOS and lipid panel/ diabetes screening Will return for fasting labs  Follow up in 1 year for annual Given ACOG print outs for PCOS and fibroids  More than 25 minutes were spent face to face with the patient in the room with more than 50% of the time spent providing counseling and discussing the plan of management.    Adrian Prows MD [facility], [facility] 03/13/2018 4:48 PM

## 2018-03-23 ENCOUNTER — Other Ambulatory Visit: Payer: Medicaid Other

## 2018-11-09 ENCOUNTER — Encounter: Payer: Self-pay | Admitting: Emergency Medicine

## 2018-11-09 ENCOUNTER — Other Ambulatory Visit: Payer: Self-pay

## 2018-11-09 ENCOUNTER — Emergency Department
Admission: EM | Admit: 2018-11-09 | Discharge: 2018-11-09 | Disposition: A | Discharge status: Home / Self Care | Payer: Medicaid Other | Attending: Emergency Medicine | Admitting: Emergency Medicine

## 2018-11-09 DIAGNOSIS — J069 Acute upper respiratory infection, unspecified: Secondary | ICD-10-CM

## 2018-11-09 DIAGNOSIS — U071 COVID-19: Secondary | ICD-10-CM | POA: Diagnosis not present

## 2018-11-09 DIAGNOSIS — Z87891 Personal history of nicotine dependence: Secondary | ICD-10-CM | POA: Diagnosis not present

## 2018-11-09 DIAGNOSIS — J029 Acute pharyngitis, unspecified: Secondary | ICD-10-CM | POA: Diagnosis present

## 2018-11-09 LAB — GROUP A STREP BY PCR: Group A Strep by PCR: NOT DETECTED

## 2018-11-09 MED ORDER — IBUPROFEN 600 MG PO TABS: 600.0000 mg | ORAL_TABLET | Freq: Three times a day (TID) | ORAL | 0 refills | Status: DC | PRN

## 2018-11-09 MED ORDER — BENZONATATE 100 MG PO CAPS: 200.0000 mg | ORAL_CAPSULE | Freq: Three times a day (TID) | ORAL | 0 refills | Status: AC | PRN

## 2018-11-09 MED ORDER — FEXOFENADINE-PSEUDOEPHED ER 60-120 MG PO TB12: 1.0000 | ORAL_TABLET | Freq: Two times a day (BID) | ORAL | 0 refills | Status: DC

## 2018-11-09 NOTE — Discharge Instructions (Addendum)
Follow discharge care instructions, take medication as directed, and self quarantine pending results of COVID-19 test.

## 2018-11-09 NOTE — ED Provider Notes (Signed)
[facility] Emergency Department Provider Note   ____________________________________________   First MD Initiated Contact with Patient 11/09/18 1404     (approximate)  I have reviewed the triage vital signs and the nursing notes.   HISTORY  Chief Complaint Sore Throat, Nasal Congestion, and Diarrhea    HPI Rachel Brennan is a 33 y.o. female patient presents with complaint of sore throat, diarrhea, nasal congestion, body aches.  Patient recently returned back from [place] 1 week ago.  Patient concerned for COVID-19.  Patient state intimating low-grade fever by home thermometer.  Patient has  nonproductive cough fatigue.  Patient rates her pain discomfort as 8/10.  No palliative measure for complaint.         Past Medical History:  Diagnosis Date  . UTI (lower urinary tract infection)     Patient Active Problem List   Diagnosis Date Noted  . PCOS (polycystic ovarian syndrome) 03/13/2018  . Submucous leiomyoma of uterus 03/13/2018  . Intramural leiomyoma of uterus 03/13/2018  . Subserosal leiomyoma of uterus 03/13/2018  . Hematuria 03/07/2015  . Hydronephrosis 03/07/2015  . Anemia 03/07/2015    Past Surgical History:  Procedure Laterality Date  . CESAREAN SECTION    . LIPOSUCTION      Prior to Admission medications   Medication Sig Start Date End Date Taking? Authorizing Provider  benzonatate (TESSALON PERLES) 100 MG capsule Take 2 capsules (200 mg total) by mouth 3 (three) times daily as needed. 11/09/18 11/09/19  Sable Feil, PA-C  fexofenadine-pseudoephedrine (ALLEGRA-D) 60-120 MG 12 hr tablet Take 1 tablet by mouth 2 (two) times daily. 11/09/18   Sable Feil, PA-C  ibuprofen (ADVIL) 600 MG tablet Take 1 tablet (600 mg total) by mouth every 8 (eight) hours as needed. 11/09/18   Sable Feil, PA-C  norelgestromin-ethinyl estradiol Marilu Favre) 150-35 MCG/24HR transdermal patch Place 1 patch onto the skin once a week. 03/02/18    Homero Fellers, MD    Allergies Patient has no known allergies.  Family History  Problem Relation Age of Onset  . Hypertension Father     Social History Social History   Tobacco Use  . Smoking status: Former Research scientist (life sciences)  . Smokeless tobacco: Never Used  Substance Use Topics  . Alcohol use: Yes    Comment: twice per month  . Drug use: No    Review of Systems Constitutional: No fever/chills Eyes: No visual changes. ENT: Sore throat.  Nasal congestion. Cardiovascular: Denies chest pain. Respiratory: Denies shortness of breath.  Nonproductive cough. Gastrointestinal: No abdominal pain.  No nausea, no vomiting.  Diarrhea.  No constipation. Genitourinary: Negative for dysuria. Musculoskeletal: Negative for back pain. Skin: Negative for rash. Neurological: Negative for headaches, focal weakness or numbness.   ____________________________________________   PHYSICAL EXAM:  VITAL SIGNS: ED Triage Vitals [11/09/18 1335]  Enc Vitals Group     BP 137/90     Pulse Rate 88     Resp 20     Temp 99.8 F (37.7 C)     Temp Source Oral     SpO2 100 %     Weight 173 lb (78.5 kg)     Height 5\' 1"  (1.549 m)     Head Circumference      Peak Flow      Pain Score 8     Pain Loc      Pain Edu?      Excl. in [place]?    Constitutional: Alert and oriented. Well appearing  and in no acute distress. No cervical spine tenderness to palpation. Hematological/Lymphatic/Immunilogical: No cervical lymphadenopathy. Cardiovascular: Normal rate, regular rhythm. Grossly normal heart sounds.  Good peripheral circulation. Respiratory: Normal respiratory effort.  No retractions. Lungs CTAB. Gastrointestinal: Soft and nontender. No distention. No abdominal bruits. No CVA tenderness. Musculoskeletal: No lower extremity tenderness nor edema.  No joint effusions. Neurologic:  Normal speech and language. No gross focal neurologic deficits are appreciated. No gait instability. Skin:  Skin is warm, dry  and intact. No rash noted. Psychiatric: Mood and affect are normal. Speech and behavior are normal.  ____________________________________________   LABS (all labs ordered are listed, but only abnormal results are displayed)  Labs Reviewed  GROUP A STREP BY PCR  NOVEL CORONAVIRUS, NAA (HOSP ORDER, SEND-OUT TO REF LAB; TAT 18-24 HRS)   ____________________________________________  EKG   ____________________________________________  RADIOLOGY  ED MD interpretation:    Official radiology report(s): No results found.  ____________________________________________   PROCEDURES  Procedure(s) performed (including Critical Care):  Procedures   ____________________________________________   INITIAL IMPRESSION / ASSESSMENT AND PLAN / ED COURSE  As part of my medical decision making, I reviewed the following data within the [place] was evaluated in Emergency Department on 11/09/2018 for the symptoms described in the history of present illness. She was evaluated in the context of the global COVID-19 pandemic, which necessitated consideration that the patient might be at risk for infection with the SARS-CoV-2 virus that causes COVID-19. Institutional protocols and algorithms that pertain to the evaluation of patients at risk for COVID-19 are in a state of rapid change based on information released by regulatory bodies including the CDC and federal and state organizations. These policies and algorithms were followed during the patient's care in the ED.  Patient recently returned back from [place] requested Covid 19 test secondary to sore throat, nasal congestion, fatigue, body aches.  Patient rapid strep test was negative.  Patient given discharge care instruction advised to self quarantine pending results of COVID-19 test.  Take medication as directed.     ____________________________________________   FINAL CLINICAL IMPRESSION(S) /  ED DIAGNOSES  Final diagnoses:  Viral URI with cough  Sore throat     ED Discharge Orders         Ordered    fexofenadine-pseudoephedrine (ALLEGRA-D) 60-120 MG 12 hr tablet  2 times daily     11/09/18 1507    benzonatate (TESSALON PERLES) 100 MG capsule  3 times daily PRN     11/09/18 1507    ibuprofen (ADVIL) 600 MG tablet  Every 8 hours PRN     11/09/18 1507           Note:  This document was prepared using Dragon voice recognition software and may include unintentional dictation errors.    Sable Feil, PA-C 11/09/18 1509    Earleen Newport, MD 11/09/18 [phone]

## 2018-11-09 NOTE — ED Notes (Signed)
Pt verbalizes understanding of d/c instructions, medications and follow up 

## 2018-11-09 NOTE — ED Notes (Signed)
See triage note  Presents with runny nose ,sore throat and diarrhea  States sx's started 3 days ago  Recently returned from [place]  Low grade fever on arrival

## 2018-11-09 NOTE — ED Triage Notes (Signed)
Pt states she just got back from [place] Friday and thinks she has COVID symptoms. Pt reports some diarrhea, sore throat and nasal congestion and body aches.

## 2018-11-11 ENCOUNTER — Telehealth: Payer: Self-pay | Admitting: Emergency Medicine

## 2018-11-11 LAB — NOVEL CORONAVIRUS, NAA (HOSP ORDER, SEND-OUT TO REF LAB; TAT 18-24 HRS): SARS-CoV-2, NAA: DETECTED — AB

## 2018-11-11 NOTE — Telephone Encounter (Signed)
Patient  Called ED regarding positive COVID results that she saw on MyChart. Patient asking what she should do next.  Informed her that the Health Department would be in contact with her. Discussed quarantine requirements and advised when to seek medical attention.  Also informed patient if close contacts require testing advised her of drive-up testing at [facility].    Advised patient to contact ED or Health Dept for further questions.  Also advised patient to view CDC website for update information on COVID-19.  Pt verbalized understanding.

## 2018-12-18 ENCOUNTER — Other Ambulatory Visit: Payer: Self-pay

## 2018-12-18 ENCOUNTER — Ambulatory Visit (LOCAL_COMMUNITY_HEALTH_CENTER): Payer: Medicaid Other | Admitting: Advanced Practice Midwife

## 2018-12-18 VITALS — BP 132/90 | Ht 61.0 in | Wt 176.0 lb

## 2018-12-18 DIAGNOSIS — Z3009 Encounter for other general counseling and advice on contraception: Secondary | ICD-10-CM | POA: Diagnosis not present

## 2018-12-18 DIAGNOSIS — E669 Obesity, unspecified: Secondary | ICD-10-CM | POA: Diagnosis not present

## 2018-12-18 DIAGNOSIS — R030 Elevated blood-pressure reading, without diagnosis of hypertension: Secondary | ICD-10-CM | POA: Insufficient documentation

## 2018-12-18 MED ORDER — NORGESTIM-ETH ESTRAD TRIPHASIC 0.18/0.215/0.25 MG-25 MCG PO TABS: 1.0000 | ORAL_TABLET | Freq: Every day | ORAL | 0 refills | Status: DC

## 2018-12-18 NOTE — Progress Notes (Signed)
   [facility] problem visit  [place] Department  Subjective:  Rachel Brennan is a 33 y.o.G1P1 nonsmoker  being seen today for birth control pills to help acne and PCOS  Chief Complaint  Patient presents with  . Contraception    wants to start OC    HPI  LMP 12/12/18, last sex 08/2014.  PCOS dx'd by [facility] 03/13/18.  BP 132/90, 119/87 Does the patient have a current or past history of drug use? No   No components found for: HCV]   Health Maintenance Due  Topic Date Due  . INFLUENZA VACCINE  08/04/2018    ROS  The following portions of the patient's history were reviewed and updated as appropriate: allergies, current medications, past family history, past medical history, past social history, past surgical history and problem list. Problem list updated.   See flowsheet for other program required questions.  Objective:   Vitals:   12/18/18 1437  BP: 132/90  Weight: 176 lb (79.8 kg)  Height: 5\' 1"  (1.549 m)    Physical Exam  n/a  Assessment and Plan:  Rachel Brennan is a 33 y.o. female presenting to the [facility] Department for a Women's Health problem visit  1. Family planning Pt wants OTC for acne.  Needs physical.  OTC #3 I po daily to begin today Needs repeat BP and physical before receives more ocp's  2. Obesity, unspecified classification, unspecified obesity type, unspecified whether serious comorbidity present   3. Elevated blood pressure reading 132/90 on 12/18/18      Return in about 3 months (around 03/18/2019) for yearly physical exam.  No future appointments.  Herbie Saxon, CNM

## 2018-12-18 NOTE — Progress Notes (Signed)
BP retaken, 119/78, provider aware. Patient signed OC consent.Jenetta Downer, RN

## 2018-12-18 NOTE — Progress Notes (Signed)
Patient here wanting to start OC. States her primary is [employer]. Patient states she saw [facility] this past year and was diagnosed with PCOS and fibroids. Want to start OC due to facial and neck acne.

## 2019-03-12 ENCOUNTER — Other Ambulatory Visit: Payer: Self-pay | Admitting: Family Medicine

## 2019-03-12 NOTE — Telephone Encounter (Signed)
Phone call to pt. Pt counseled that per notes from last visit, provider wanted BP reevaluated before prescribing anymore OCP. Pt scheduled for 03/14/2019 provider visit.

## 2019-03-12 NOTE — Telephone Encounter (Signed)
Patient needs refill on her BC.

## 2019-03-14 ENCOUNTER — Ambulatory Visit: Payer: Self-pay

## 2019-03-15 ENCOUNTER — Encounter: Payer: Self-pay | Admitting: Family Medicine

## 2019-03-15 ENCOUNTER — Other Ambulatory Visit: Payer: Self-pay

## 2019-03-15 ENCOUNTER — Ambulatory Visit (LOCAL_COMMUNITY_HEALTH_CENTER): Payer: Medicaid Other | Admitting: Family Medicine

## 2019-03-15 VITALS — BP 131/99 | Ht 61.0 in | Wt 175.4 lb

## 2019-03-15 DIAGNOSIS — Z124 Encounter for screening for malignant neoplasm of cervix: Secondary | ICD-10-CM | POA: Diagnosis not present

## 2019-03-15 DIAGNOSIS — Z3009 Encounter for other general counseling and advice on contraception: Secondary | ICD-10-CM

## 2019-03-15 DIAGNOSIS — E282 Polycystic ovarian syndrome: Secondary | ICD-10-CM

## 2019-03-15 MED ORDER — NORGESTIM-ETH ESTRAD TRIPHASIC 0.18/0.215/0.25 MG-25 MCG PO TABS: 1.0000 | ORAL_TABLET | Freq: Every day | ORAL | 4 refills | Status: DC

## 2019-03-15 NOTE — Progress Notes (Signed)
Repeat BP 128/92, provider aware.Jenetta Downer, RN

## 2019-03-15 NOTE — Progress Notes (Signed)
Patient here for more OC. Patient was taking OTC, given 3 packs on 12/18/2018 and took last pill yesterday. Per provider notes from last visit, patient needs PE and repeat BP before receiving more OC. Per [facility], Pap test due 2021 and CBE due 2019. Last Pap 04/30/2014, NIL. Patient taking OC for menstrual regulation and PCOS and states she has seen much improvement and would like to continue with same OC's.Jenetta Downer, RN

## 2019-03-15 NOTE — Progress Notes (Signed)
Contraception/Family Planning VISIT ENCOUNTER NOTE  Subjective:   Rachel Brennan is a 34 y.o. G41P1001 female here for reproductive life counseling.  Desires cycle control. Has a female partner and does not need pregnancy prevention from [facility].  Reports she does not want a pregnancy in the next year. Denies abnormal vaginal bleeding, discharge, pelvic pain, problems with intercourse or other gynecologic concerns.    Gynecologic History Patient's last menstrual period was 03/10/2019 (exact date). Contraception: OCP (estrogen/progesterone)  Health Maintenance Due  Topic Date Due  . INFLUENZA VACCINE  Never done     The following portions of the patient's history were reviewed and updated as appropriate: allergies, current medications, past family history, past medical history, past social history, past surgical history and problem list.  Review of Systems Pertinent items are noted in HPI.   Objective:  BP (!) 131/99   Ht 5\' 1"  (1.549 m)   Wt 175 lb 6.4 oz (79.6 kg)   LMP 03/10/2019 (Exact Date)   BMI 33.14 kg/m  Gen: well appearing, NAD HEENT: no scleral icterus CV: RR Lung: Normal WOB Ext: warm well perfused  PELVIC: Normal appearing external genitalia; normal appearing vaginal mucosa and cervix.  No abnormal discharge noted.  Pap smear obtained.  Normal uterine size, no other palpable masses, no uterine or adnexal tenderness.   Assessment and Plan:   Contraception counseling: Reviewed all forms of birth control options in the tiered based approach. available including abstinence; over the counter/barrier methods; hormonal contraceptive medication including pill, patch, ring, injection,contraceptive implant; hormonal and nonhormonal IUDs; permanent sterilization options including vasectomy and the various tubal sterilization modalities. Risks, benefits, and typical effectiveness rates were reviewed.  Questions were answered.  Written information was also given to the patient to  review.  Patient desires OCP, this was prescribed for patient. She will follow up in  1 yer for surveillance.  She was told to call with any further questions, or with any concerns about this method of contraception.  Emphasized use of condoms 100% of the time for STI prevention.  ECP not indicated.   1. PCOS (polycystic ovarian syndrome) dx'd 03/13/18 [facility] - will continue OCP for cycle control and risk reduction for endometrial hyperplasia - Initial DBP was elevated, repeat was improved. Safe for estrogen containing medication. Recommend follow up for BP  - Norgestimate-Ethinyl Estradiol Triphasic 0.18/0.215/0.25 MG-25 MCG tab; Take 1 tablet by mouth daily.  Dispense: 3 Package; Refill: 4  2. Screening for cervical cancer - IGP, Aptima HPV  Discussed need for CBE, declines today based on time but will get one at next visit. Discussed self breast exam and breast awareness.     Please refer to After Visit Summary for other counseling recommendations.   No follow-ups on file.  Caren Macadam, MD [place]

## 2019-03-21 LAB — IGP, APTIMA HPV
HPV Aptima: NEGATIVE
PAP Smear Comment: 0

## 2020-05-18 ENCOUNTER — Telehealth: Payer: Self-pay

## 2020-05-18 NOTE — Telephone Encounter (Signed)
[facility] referring for Pelvic pain, possible fibroids. Paper records.Called and left voicemail for patient to call back to be scheduled.

## 2020-05-19 NOTE — Telephone Encounter (Signed)
Called and left genericvoicemail for patient to call back to be scheduled.

## 2020-06-02 ENCOUNTER — Ambulatory Visit (INDEPENDENT_AMBULATORY_CARE_PROVIDER_SITE_OTHER): Payer: Medicaid Other | Admitting: Obstetrics & Gynecology

## 2020-06-02 ENCOUNTER — Encounter: Payer: Self-pay | Admitting: Obstetrics & Gynecology

## 2020-06-02 ENCOUNTER — Other Ambulatory Visit: Payer: Self-pay

## 2020-06-02 VITALS — BP 120/80 | Ht 61.0 in | Wt 177.0 lb

## 2020-06-02 DIAGNOSIS — N920 Excessive and frequent menstruation with regular cycle: Secondary | ICD-10-CM

## 2020-06-02 DIAGNOSIS — D250 Submucous leiomyoma of uterus: Secondary | ICD-10-CM

## 2020-06-02 DIAGNOSIS — D251 Intramural leiomyoma of uterus: Secondary | ICD-10-CM | POA: Diagnosis not present

## 2020-06-02 DIAGNOSIS — E282 Polycystic ovarian syndrome: Secondary | ICD-10-CM | POA: Diagnosis not present

## 2020-06-02 DIAGNOSIS — N946 Dysmenorrhea, unspecified: Secondary | ICD-10-CM | POA: Diagnosis not present

## 2020-06-02 MED ORDER — TRANEXAMIC ACID 650 MG PO TABS: 1300.0000 mg | ORAL_TABLET | Freq: Three times a day (TID) | ORAL | 2 refills | Status: DC

## 2020-06-02 NOTE — Progress Notes (Signed)
Uterine Fibroids Patient is a 35 yo G1P1 HF who presents with a history of symptoms worsening from uterine fibroids. Periods are regular every 28-30 days, lasting 7-9 days. Dysmenorrhea:severe, occurring throughout menses.  NSAIDs min help, even trying Percocet one time did not help.  Cyclic symptoms include acne and difficulty losing weight.  She also has urinary pressure, frequency, and LOU w stress.   No intermenstrual bleeding, spotting, or discharge.  She was dx w fibroids in 2020.  No FH fibroids.  No prior procedures. [place] 2020:  The uterus is anteverted and measures 10.1 x 6.7 x 5.6cm. Echo texture is homogenous with evidence of focal masses. Within the uterus are multiple suspected fibroids measuring: Fibroid 1:  0.9 x 0.9 x 0.5cm (anterior, SS) Fibroid 2:  1.3 x 1.1 x 1.2cm (anterior, SM) Fibroid 3:  1.8 x 1.5 x 1.4cm (posterior, IM) Fibroid 4 vs heterogeneous tissue:  1.7 x 1.5 x 1.7cm (posterior, SM) The Endometrium measures 9.9 mm. Multiple small peripheral follicles seen on bilateral ovaries. Ultrasound appearance of PCOS.    She has also been dx w PCOS, and feels strongly that she would like ovaries removed.  She has tried OCPs in past and had side effects, does not want to take birth control hormones.  She does not desire another pregnancy (CS in 2009).  PMHx: She  has a past medical history of PCOS (polycystic ovarian syndrome) and UTI (lower urinary tract infection). Also,  has a past surgical history that includes Cesarean section and Liposuction., family history includes Anemia in her mother; Diabetes in her maternal grandmother; Hypertension in her father and mother.,  reports that she has quit smoking. She has never used smokeless tobacco. She reports current alcohol use. She reports that she does not use drugs.  She has a current medication list which includes the following prescription(s): inositol-d chiro-inositol, tranexamic acid, fexofenadine-pseudoephedrine, and  ibuprofen. Also, has No Known Allergies.  Review of Systems  Constitutional: Positive for malaise/fatigue. Negative for chills and fever.  HENT: Negative for congestion, sinus pain and sore throat.   Eyes: Negative for blurred vision and pain.  Respiratory: Negative for cough and wheezing.   Cardiovascular: Negative for chest pain and leg swelling.  Gastrointestinal: Positive for abdominal pain. Negative for constipation, diarrhea, heartburn, nausea and vomiting.  Genitourinary: Positive for frequency. Negative for dysuria, hematuria and urgency.  Musculoskeletal: Negative for back pain, joint pain, myalgias and neck pain.  Skin: Negative for itching and rash.  Neurological: Positive for tingling. Negative for dizziness, tremors and weakness.  Endo/Heme/Allergies: Does not bruise/bleed easily.  Psychiatric/Behavioral: Negative for depression. The patient is nervous/anxious. The patient does not have insomnia.     Objective: BP 120/80   Ht 5\' 1"  (1.549 m)   Wt 177 lb (80.3 kg)   LMP 05/02/2020   BMI 33.44 kg/m  Physical Exam Constitutional:      General: She is not in acute distress.    Appearance: She is well-developed.  Genitourinary:     No vaginal erythema or bleeding.      Right Adnexa: not tender and no mass present.    Left Adnexa: not tender and no mass present.    No cervical motion tenderness, discharge, polyp or nabothian cyst.     Uterus is enlarged and irregular.     No uterine mass detected.    Uterus exam comments: 8 weeks sized.     Uterus is midaxial.     Pelvic exam was performed with patient in  the lithotomy position.  HENT:     Head: Normocephalic and atraumatic.     Nose: Nose normal.  Abdominal:     General: There is no distension.     Palpations: Abdomen is soft.     Tenderness: There is no abdominal tenderness.     Comments: Scars from abdominoplasty noted  Musculoskeletal:        General: Normal range of motion.  Neurological:     Mental  Status: She is alert and oriented to person, place, and time.     Cranial Nerves: No cranial nerve deficit.  Skin:    General: Skin is warm and dry.  Psychiatric:        Attention and Perception: Attention normal.        Mood and Affect: Mood and affect normal.        Speech: Speech normal.        Behavior: Behavior normal.        Thought Content: Thought content normal.        Judgment: Judgment normal.     ASSESSMENT/PLAN:   Problem List Items Addressed This Visit      Endocrine   PCOS (polycystic ovarian syndrome) dx'd 03/13/18 [facility]     Genitourinary   Intramural and submucous leiomyoma of uterus   Relevant Orders   US PELVIC COMPLETE WITH TRANSVAGINAL   Dysmenorrhea     Other   Menorrhagia with regular cycle - Primary   Relevant Medications   tranexamic acid (LYSTEDA) 650 MG TABS tablet   Other Relevant Orders   US PELVIC COMPLETE WITH TRANSVAGINAL    Discussed options for treatment of fibroids and pt prefers hysterectomy.  Info given.  Understands no fertility after surgery. Fibroid treatment such as [place], Lupron, Myomectomy, and Hysterectomy discussed in detail, with the pros and cons of each choice counseled.  No treatment as an option also discussed, as well as control of symptoms alone with hormone therapy. Information provided to the patient.  Also discussed pros and cons of ovarian preservation (continued PCOS sx's) vs removal (immediate menopause) as patient is concerned w continued acne and weight issues related to PCOS.  [place] first to assess growth and position (to help direct surgery as well as assessment of their characteristics).   Lysteda discussed for next period to help minimize sx's, limitations and side effects discussed.  Barnett Applebaum, MD, [facility], [facility] 06/02/2020  8:55 AM

## 2020-06-02 NOTE — Patient Instructions (Signed)
Total Laparoscopic Hysterectomy A total laparoscopic hysterectomy is a minimally invasive surgery to remove the uterus and cervix. The fallopian tubes and ovaries can also be removed during this surgery, if necessary. This procedure may be done to treat problems such as:  Growths in the uterus (uterine fibroids) that are not cancer but cause symptoms.  A condition that causes the lining of the uterus to grow in other areas (endometriosis).  Problems with pelvic support.  Cancer of the cervix, ovaries, uterus, or tissue that lines the uterus (endometrium).  Excessive bleeding in the uterus. After this procedure, you will no longer be able to have a baby, and you will no longer have a menstrual period. Tell a health care provider about:  Any allergies you have.  All medicines you are taking, including vitamins, herbs, eye drops, creams, and over-the-counter medicines.  Any problems you or family members have had with anesthetic medicines.  Any blood disorders you have.  Any surgeries you have had.  Any medical conditions you have.  Whether you are pregnant or may be pregnant. What are the risks? Generally, this is a safe procedure. However, problems may occur, including:  Infection.  Bleeding.  Blood clots in the legs or lungs.  Allergic reactions to medicines.  Damage to nearby structures or organs.  Having to change from this surgery to one in which a large incision is made in the abdomen (abdominal hysterectomy). What happens before the procedure? Staying hydrated Follow instructions from your health care provider about hydration, which may include:  Up to 2 hours before the procedure - you may continue to drink clear liquids, such as water, clear fruit juice, black coffee, and plain tea.   Eating and drinking restrictions Follow instructions from your health care provider about eating and drinking, which may include:  8 hours before the procedure - stop eating  heavy meals or foods, such as meat, fried foods, or fatty foods.  6 hours before the procedure - stop eating light meals or foods, such as toast or cereal.  6 hours before the procedure - stop drinking milk or drinks that contain milk.  2 hours before the procedure - stop drinking clear liquids. Medicines  Ask your health care provider about: ? Changing or stopping your regular medicines. This is especially important if you are taking diabetes medicines or blood thinners. ? Taking medicines such as aspirin and ibuprofen. These medicines can thin your blood. Do not take these medicines unless your health care provider tells you to take them. ? Taking over-the-counter medicines, vitamins, herbs, and supplements.  You may be asked to take medicine that helps you have a bowel movement (laxative) to prevent constipation. General instructions  If you were asked to do bowel preparation before the procedure, follow instructions from your health care provider.  This procedure can affect the way you feel about yourself. Talk with your health care provider about the physical and emotional changes hysterectomy may cause.  Do not use any products that contain nicotine or tobacco for at least 4 weeks before the procedure. These products include cigarettes, chewing tobacco, and vaping devices, such as e-cigarettes. If you need help quitting, ask your health care provider.  Plan to have a responsible adult take you home from the hospital or clinic.  Plan to have a responsible adult care for you for the time you are told after you leave the hospital or clinic. This is important. Surgery safety Ask your health care provider:  How your surgery  site will be marked.  What steps will be taken to help prevent infection. These may include: ? Removing hair at the surgery site. ? Washing skin with a germ-killing soap. ? Receiving antibiotic medicine. What happens during the procedure?  An IV will be  inserted into one of your veins.  You will be given one or more of the following: ? A medicine to help you relax (sedative). ? A medicine to make you fall asleep (general anesthetic). ? A medicine to numb the area (local anesthetic). ? A medicine that is injected into your spine to numb the area below and slightly above the injection site (spinal anesthetic). ? A medicine that is injected into an area of your body to numb everything below the injection site (regional anesthetic).  A gas will be used to inflate your abdomen. This will allow your surgeon to look inside your abdomen and do the surgery.  Three or four small incisions will be made in your abdomen.  A small device with a light (laparoscope) will be inserted into one of your incisions. Surgical instruments will be inserted through the other incisions in order to perform the procedure.  Your uterus and cervix may be removed through your vagina or cut into small pieces and removed through the small incisions. Any other organs that need to be removed will also be removed this way.  The gas will be released from inside your abdomen.  Your incisions will be closed with stitches (sutures), skin glue, or adhesive strips.  A bandage (dressing) may be placed over your incisions. The procedure may vary among health care providers and hospitals. What happens after the procedure?  Your blood pressure, heart rate, breathing rate, and blood oxygen level will be monitored until you leave the hospital or clinic.  You will be given medicine for pain as needed.  You will be encouraged to walk as soon as possible. You will also use a device to help you breathe or do breathing exercises to keep your lungs clear.  You may have to wear compression stockings. These stockings help to prevent blood clots and reduce swelling in your legs.  You will need to wear a sanitary pad for vaginal discharge or bleeding. Summary  Total laparoscopic  hysterectomy is a procedure to remove your uterus, cervix, and sometimes the fallopian tubes and ovaries.  This procedure can affect the way you feel about yourself. Talk with your health care provider about the physical and emotional changes hysterectomy may cause.  After this procedure, you will no longer be able to have a baby, and you will no longer have a menstrual period.  You will be given pain medicine to control discomfort after this procedure.  Plan to have a responsible adult take you home from the hospital or clinic. This information is not intended to replace advice given to you by your health care provider. Make sure you discuss any questions you have with your health care provider. Document Revised: 08/23/2019 Document Reviewed: 08/23/2019 Elsevier Patient Education  [place].

## 2020-07-08 ENCOUNTER — Ambulatory Visit: Payer: Medicaid Other

## 2020-07-14 ENCOUNTER — Ambulatory Visit
Admission: RE | Admit: 2020-07-14 | Discharge: 2020-07-14 | Disposition: A | Discharge status: Home / Self Care | Payer: Medicaid Other | Source: Ambulatory Visit | Attending: Obstetrics & Gynecology | Admitting: Obstetrics & Gynecology

## 2020-07-14 ENCOUNTER — Other Ambulatory Visit: Payer: Self-pay

## 2020-07-14 DIAGNOSIS — N920 Excessive and frequent menstruation with regular cycle: Secondary | ICD-10-CM | POA: Insufficient documentation

## 2020-07-14 DIAGNOSIS — D251 Intramural leiomyoma of uterus: Secondary | ICD-10-CM | POA: Insufficient documentation

## 2020-07-14 DIAGNOSIS — D250 Submucous leiomyoma of uterus: Secondary | ICD-10-CM | POA: Insufficient documentation

## 2020-07-20 ENCOUNTER — Encounter: Payer: Self-pay | Admitting: Obstetrics & Gynecology

## 2020-07-20 ENCOUNTER — Other Ambulatory Visit: Payer: Self-pay

## 2020-07-20 ENCOUNTER — Ambulatory Visit (INDEPENDENT_AMBULATORY_CARE_PROVIDER_SITE_OTHER): Payer: Medicaid Other | Admitting: Obstetrics & Gynecology

## 2020-07-20 VITALS — BP 100/70 | Ht 61.0 in | Wt 170.0 lb

## 2020-07-20 DIAGNOSIS — E282 Polycystic ovarian syndrome: Secondary | ICD-10-CM | POA: Diagnosis not present

## 2020-07-20 DIAGNOSIS — N920 Excessive and frequent menstruation with regular cycle: Secondary | ICD-10-CM | POA: Diagnosis not present

## 2020-07-20 DIAGNOSIS — N946 Dysmenorrhea, unspecified: Secondary | ICD-10-CM

## 2020-07-20 NOTE — Progress Notes (Signed)
  HPI: Uterine Fibroids Patient is a 35 yo G1P1 HF who presents with uterine fibroids. Periods are regular every 28-30 days, lasting a few days. Dysmenorrhea:severe usually, although last 2 cycles have been lessened. Cyclic symptoms include  acnes and diffculty losing weight (related to her known PCOS) . No intermenstrual bleeding, spotting, or discharge.  Ultrasound demonstrates small fibroids, no change from [place] 2020    No cysts on ovaries  PMHx: She  has a past medical history of PCOS (polycystic ovarian syndrome) and UTI (lower urinary tract infection). Also,  has a past surgical history that includes Cesarean section and Liposuction., family history includes Anemia in her mother; Diabetes in her maternal grandmother; Hypertension in her father and mother.,  reports that she has quit smoking. She has never used smokeless tobacco. She reports current alcohol use. She reports that she does not use drugs.  She has a current medication list which includes the following prescription(s): fexofenadine-pseudoephedrine, ibuprofen, inositol-d chiro-inositol, and tranexamic acid. Also, has No Known Allergies.  Review of Systems  All other systems reviewed and are negative.  Objective: BP 100/70   Ht 5\' 1"  (1.549 m)   Wt 170 lb (77.1 kg)   LMP 07/14/2020   BMI 32.12 kg/m   Physical examination Constitutional NAD, Conversant  Skin No rashes, lesions or ulceration.   Extremities: Moves all appropriately.  Normal ROM for age. No lymphadenopathy.  Neuro: Grossly intact  Psych: Oriented to PPT.  Normal mood. Normal affect.   Assessment:  Menorrhagia with regular cycle  Dysmenorrhea  PCOS (polycystic ovarian syndrome) dx'd 03/13/18 [facility]  Hysterectomy as option for surgery,  and preservation of ovaries recommended.  Also, no surgery as option also discussed.  Will monitor for now.  May schedule at a later time if periods continue to be of bother. Tumeric discussed as possible help to  dysmenorrhea. Skin care for acne.  Discussed fibroids.  As hers are small and not growing, no rush for intervention.  A total of 20 minutes were spent face-to-face with the patient as well as preparation, review, communication, and documentation during this encounter.   Barnett Applebaum, MD, [facility], [facility] 07/20/2020  2:22 PM

## 2020-12-23 ENCOUNTER — Encounter: Payer: Self-pay | Admitting: Obstetrics & Gynecology

## 2020-12-23 ENCOUNTER — Other Ambulatory Visit: Payer: Self-pay

## 2020-12-23 ENCOUNTER — Ambulatory Visit (INDEPENDENT_AMBULATORY_CARE_PROVIDER_SITE_OTHER): Payer: Medicaid Other | Admitting: Obstetrics & Gynecology

## 2020-12-23 VITALS — BP 118/74 | Ht 61.0 in | Wt 172.0 lb

## 2020-12-23 DIAGNOSIS — D251 Intramural leiomyoma of uterus: Secondary | ICD-10-CM | POA: Diagnosis not present

## 2020-12-23 DIAGNOSIS — D250 Submucous leiomyoma of uterus: Secondary | ICD-10-CM | POA: Diagnosis not present

## 2020-12-23 DIAGNOSIS — N920 Excessive and frequent menstruation with regular cycle: Secondary | ICD-10-CM

## 2020-12-23 NOTE — Progress Notes (Signed)
°  Uterine Fibroids Patient is a 35 yo G76P1 F who presents with symptoms related to uterine fibroids. Periods are regular every 28-30 days, lasting several days. Dysmenorrhea:moderate, occurring throughout menses. Cyclic symptoms include weight gain and acne . Also has bladder pressure, frequency, and pain w voids most often during cycle. Pt has PCOS.  Pt does not desire pregnancy.  Pt's partner is female. No intermenstrual bleeding, spotting, or discharge.  Prior US showing fibroids.  PMHx: She  has a past medical history of PCOS (polycystic ovarian syndrome) and UTI (lower urinary tract infection). Also,  has a past surgical history that includes Cesarean section and Liposuction., family history includes Anemia in her mother; Diabetes in her maternal grandmother; Hypertension in her father and mother.,  reports that she has quit smoking. She has never used smokeless tobacco. She reports current alcohol use. She reports that she does not use drugs.  She has a current medication list which includes the following prescription(s): fexofenadine-pseudoephedrine, ibuprofen, inositol-d chiro-inositol, and tranexamic acid. Also, has No Known Allergies.  Review of Systems  All other systems reviewed and are negative.  Objective: BP 118/74    Ht 5\' 1"  (1.549 m)    Wt 172 lb (78 kg)    LMP 11/24/2020 (Exact Date)    BMI 32.50 kg/m  Physical Exam Constitutional:      General: She is not in acute distress.    Appearance: She is well-developed.  Musculoskeletal:        General: Normal range of motion.  Neurological:     Mental Status: She is alert and oriented to person, place, and time.  Skin:    General: Skin is warm and dry.  Vitals reviewed.    ASSESSMENT/PLAN:   Problem List Items Addressed This Visit       Genitourinary   Intramural and submucous leiomyoma of uterus     Other   Menorrhagia with regular cycle - Primary  Fibroid treatment such as [place], Lupron, Myomectomy, and Hysterectomy  discussed in detail, with the pros and cons of each choice counseled.  No treatment as an option also discussed, as well as control of symptoms alone with hormone therapy. Information provided to the patient.  Due to sx's, pt desires definitive surgical management by way of hysterectomy, to be schedule din January. Info given as to Alta Sierra, MD, [facility], [facility] 12/23/2020  4:24 PM

## 2020-12-23 NOTE — Patient Instructions (Signed)
Total Laparoscopic Hysterectomy A total laparoscopic hysterectomy is a minimally invasive surgery to remove the uterus and cervix. The fallopian tubes and ovaries can also be removed during this surgery, if necessary. This procedure may be done to treat problems such as: Growths in the uterus (uterine fibroids) that are not cancer but cause symptoms. A condition that causes the lining of the uterus to grow in other areas (endometriosis). Problems with pelvic support. Cancer of the cervix, ovaries, uterus, or tissue that lines the uterus (endometrium). Excessive bleeding in the uterus. After this procedure, you will no longer be able to have a baby, and you will no longer have a menstrual period. Tell a health care provider about: Any allergies you have. All medicines you are taking, including vitamins, herbs, eye drops, creams, and over-the-counter medicines. Any problems you or family members have had with anesthetic medicines. Any blood disorders you have. Any surgeries you have had. Any medical conditions you have. Whether you are pregnant or may be pregnant. What are the risks? Generally, this is a safe procedure. However, problems may occur, including: Infection. Bleeding. Blood clots in the legs or lungs. Allergic reactions to medicines. Damage to nearby structures or organs. Having to change from this surgery to one in which a large incision is made in the abdomen (abdominal hysterectomy). What happens before the procedure? Staying hydrated Follow instructions from your health care provider about hydration, which may include: Up to 2 hours before the procedure - you may continue to drink clear liquids, such as water, clear fruit juice, black coffee, and plain tea.  Eating and drinking restrictions Follow instructions from your health care provider about eating and drinking, which may include: 8 hours before the procedure - stop eating heavy meals or foods, such as meat, fried  foods, or fatty foods. 6 hours before the procedure - stop eating light meals or foods, such as toast or cereal. 6 hours before the procedure - stop drinking milk or drinks that contain milk. 2 hours before the procedure - stop drinking clear liquids. Medicines Ask your health care provider about: Changing or stopping your regular medicines. This is especially important if you are taking diabetes medicines or blood thinners. Taking medicines such as aspirin and ibuprofen. These medicines can thin your blood. Do not take these medicines unless your health care provider tells you to take them. Taking over-the-counter medicines, vitamins, herbs, and supplements. You may be asked to take medicine that helps you have a bowel movement (laxative) to prevent constipation. General instructions If you were asked to do bowel preparation before the procedure, follow instructions from your health care provider. This procedure can affect the way you feel about yourself. Talk with your health care provider about the physical and emotional changes hysterectomy may cause. Do not use any products that contain nicotine or tobacco for at least 4 weeks before the procedure. These products include cigarettes, chewing tobacco, and vaping devices, such as e-cigarettes. If you need help quitting, ask your health care provider. Plan to have a responsible adult take you home from the hospital or clinic. Plan to have a responsible adult care for you for the time you are told after you leave the hospital or clinic. This is important. Surgery safety Ask your health care provider: How your surgery site will be marked. What steps will be taken to help prevent infection. These may include: Removing hair at the surgery site. Washing skin with a germ-killing soap. Receiving antibiotic medicine. What happens during the  procedure? An IV will be inserted into one of your veins. You will be given one or more of the following: A  medicine to help you relax (sedative). A medicine to make you fall asleep (general anesthetic). A medicine to numb the area (local anesthetic). A medicine that is injected into your spine to numb the area below and slightly above the injection site (spinal anesthetic). A medicine that is injected into an area of your body to numb everything below the injection site (regional anesthetic). A gas will be used to inflate your abdomen. This will allow your surgeon to look inside your abdomen and do the surgery. Three or four small incisions will be made in your abdomen. A small device with a light (laparoscope) will be inserted into one of your incisions. Surgical instruments will be inserted through the other incisions in order to perform the procedure. Your uterus and cervix may be removed through your vagina or cut into small pieces and removed through the small incisions. Any other organs that need to be removed will also be removed this way. The gas will be released from inside your abdomen. Your incisions will be closed with stitches (sutures), skin glue, or adhesive strips. A bandage (dressing) may be placed over your incisions. The procedure may vary among health care providers and hospitals. What happens after the procedure? Your blood pressure, heart rate, breathing rate, and blood oxygen level will be monitored until you leave the hospital or clinic. You will be given medicine for pain as needed. You will be encouraged to walk as soon as possible. You will also use a device to help you breathe or do breathing exercises to keep your lungs clear. You may have to wear compression stockings. These stockings help to prevent blood clots and reduce swelling in your legs. You will need to wear a sanitary pad for vaginal discharge or bleeding. Summary Total laparoscopic hysterectomy is a procedure to remove your uterus, cervix, and sometimes the fallopian tubes and ovaries. This procedure can  affect the way you feel about yourself. Talk with your health care provider about the physical and emotional changes hysterectomy may cause. After this procedure, you will no longer be able to have a baby, and you will no longer have a menstrual period. You will be given pain medicine to control discomfort after this procedure. Plan to have a responsible adult take you home from the hospital or clinic. This information is not intended to replace advice given to you by your health care provider. Make sure you discuss any questions you have with your health care provider. Document Revised: 08/23/2019 Document Reviewed: 08/23/2019 Elsevier Patient Education  [place].

## 2020-12-25 ENCOUNTER — Other Ambulatory Visit: Payer: Self-pay | Admitting: Obstetrics & Gynecology

## 2020-12-25 ENCOUNTER — Telehealth: Payer: Self-pay | Admitting: Obstetrics & Gynecology

## 2020-12-25 DIAGNOSIS — N920 Excessive and frequent menstruation with regular cycle: Secondary | ICD-10-CM

## 2020-12-25 NOTE — Telephone Encounter (Signed)
Patient is scheduled for H&P w/ Dr. Kenton Kingfisher on 1/16 @ 3:35pm, Pre-admit Testing phone interview to be scheduled (patient will watch for MyChart notification), OR on 01/26/21, and a 2 wk post op on 2/8 @ 2:35pm.

## 2020-12-25 NOTE — Telephone Encounter (Signed)
-----   Message from Gae Dry, MD sent at 12/23/2020  4:23 PM EST ----- Regarding: Surgery Surgery Booking Request Patient Full Name:  Rachel Brennan  MRN: [id number]  DOB: 01-11-1985  Surgeon: Hoyt Koch, MD  Requested Surgery Date and Time: Jan 24 Primary Diagnosis AND Code:   ICD-10-CM  1. Menorrhagia with regular cycle  N92.0   2. Intramural and submucous leiomyoma of uterus  D25.1  Secondary Diagnosis and Code:  Surgical Procedure: TLH/BS, Cystoscopy RNFA Requested?: No L&D Notification: No Admission Status: same day surgery Length of Surgery: 75 min Special Case Needs: No H&P: Yes Phone Interview???:  Yes Interpreter: No Medical Clearance:  No Special Scheduling Instructions: No Any known health/anesthesia issues, diabetes, sleep apnea, latex allergy, defibrillator/pacemaker?: No Acuity: P3   (P1 highest, P2 delay may cause harm, P3 low, elective gyn, P4 lowest) Post op follow up visits: 2 weeks after surgery

## 2021-01-18 ENCOUNTER — Ambulatory Visit (INDEPENDENT_AMBULATORY_CARE_PROVIDER_SITE_OTHER): Payer: Medicaid Other | Admitting: Obstetrics & Gynecology

## 2021-01-18 ENCOUNTER — Other Ambulatory Visit: Payer: Self-pay

## 2021-01-18 ENCOUNTER — Encounter: Payer: Self-pay | Admitting: Obstetrics & Gynecology

## 2021-01-18 VITALS — BP 120/80 | Ht 61.0 in | Wt 169.0 lb

## 2021-01-18 DIAGNOSIS — N920 Excessive and frequent menstruation with regular cycle: Secondary | ICD-10-CM

## 2021-01-18 DIAGNOSIS — D251 Intramural leiomyoma of uterus: Secondary | ICD-10-CM

## 2021-01-18 DIAGNOSIS — D250 Submucous leiomyoma of uterus: Secondary | ICD-10-CM

## 2021-01-18 NOTE — H&P (View-Only) (Signed)
PRE-OPERATIVE HISTORY AND PHYSICAL EXAM  HPI:  Rachel Brennan is a 36 y.o. G1P1001 Patient's last menstrual period was 12/25/2020.; she is being admitted for surgery related to abnormal uterine bleeding, fibroids, and pelvic pain.  Also has bladder pressure, frequency, and pain w voids most often during cycle.  [place]: Measurements: 9.6 x 5 9 x 6.3 cm = volume: 1862 mL. Uterus is anteverted. 1.7 x 1.6 x 1.4 cm intramural to submucosal fibroid present at the left mid uterine body. Additional 1.1 x 0.8 x 0.8 cm intramural fibroid present at the left lower uterine segment. Prominent 1.4 cm nabothian cyst noted at the cervix.  PMHx: Past Medical History:  Diagnosis Date   PCOS (polycystic ovarian syndrome)    UTI (lower urinary tract infection)    Past Surgical History:  Procedure Laterality Date   CESAREAN SECTION     LIPOSUCTION     Family History  Problem Relation Age of Onset   Hypertension Father    Anemia Mother    Hypertension Mother    Diabetes Maternal Grandmother    Social History   Tobacco Use   Smoking status: Former   Smokeless tobacco: Never  Scientific laboratory technician Use: Never used  Substance Use Topics   Alcohol use: Yes    Comment: twice per month   Drug use: No    Current Outpatient Medications:    fexofenadine-pseudoephedrine (ALLEGRA-D) 60-120 MG 12 hr tablet, Take 1 tablet by mouth 2 (two) times daily. (Patient not taking: Reported on 01/18/2021), Disp: 20 tablet, Rfl: 0   ibuprofen (ADVIL) 600 MG tablet, Take 1 tablet (600 mg total) by mouth every 8 (eight) hours as needed. (Patient not taking: Reported on 01/18/2021), Disp: 15 tablet, Rfl: 0   tranexamic acid (LYSTEDA) 650 MG TABS tablet, Take 2 tablets (1,300 mg total) by mouth 3 (three) times daily. Take at onset of period. Take during menses for a maximum of five days (Patient not taking: Reported on 01/18/2021), Disp: 30 tablet, Rfl: 2 Allergies: Patient has no known allergies.  Review of Systems   Constitutional:  Negative for chills, fever and malaise/fatigue.  HENT:  Negative for congestion, sinus pain and sore throat.   Eyes:  Negative for blurred vision and pain.  Respiratory:  Negative for cough and wheezing.   Cardiovascular:  Negative for chest pain and leg swelling.  Gastrointestinal:  Negative for abdominal pain, constipation, diarrhea, heartburn, nausea and vomiting.  Genitourinary:  Negative for dysuria, frequency, hematuria and urgency.  Musculoskeletal:  Negative for back pain, joint pain, myalgias and neck pain.  Skin:  Negative for itching and rash.  Neurological:  Negative for dizziness, tremors and weakness.  Endo/Heme/Allergies:  Does not bruise/bleed easily.  Psychiatric/Behavioral:  Negative for depression. The patient is not nervous/anxious and does not have insomnia.    Objective: BP 120/80    Ht 5\' 1"  (1.549 m)    Wt 169 lb (76.7 kg)    LMP 12/25/2020    BMI 31.93 kg/m   Filed Weights   01/18/21 1535  Weight: 169 lb (76.7 kg)   Physical Exam Constitutional:      General: She is not in acute distress.    Appearance: She is well-developed.  HENT:     Head: Normocephalic and atraumatic. No laceration.     Right Ear: Hearing normal.     Left Ear: Hearing normal.     Mouth/Throat:     Pharynx: Uvula midline.  Eyes:  Pupils: Pupils are equal, round, and reactive to light.  Neck:     Thyroid: No thyromegaly.  Cardiovascular:     Rate and Rhythm: Normal rate and regular rhythm.     Heart sounds: No murmur heard.   No friction rub. No gallop.  Pulmonary:     Effort: Pulmonary effort is normal. No respiratory distress.     Breath sounds: Normal breath sounds. No wheezing.  Abdominal:     General: Bowel sounds are normal. There is no distension.     Palpations: Abdomen is soft.     Tenderness: There is no abdominal tenderness. There is no rebound.  Musculoskeletal:        General: Normal range of motion.     Cervical back: Normal range of motion  and neck supple.  Neurological:     Mental Status: She is alert and oriented to person, place, and time.     Cranial Nerves: No cranial nerve deficit.  Skin:    General: Skin is warm and dry.  Psychiatric:        Judgment: Judgment normal.  Vitals reviewed.    Assessment: 1. Intramural and submucous leiomyoma of uterus   2. Menorrhagia with regular cycle   Plan TLH, BS, Cysto  I have had a careful discussion with this patient about all the options available and the risk/benefits of each. I have fully informed this patient that surgery may subject her to a variety of discomforts and risks: She understands that most patients have surgery with little difficulty, but problems can happen ranging from minor to fatal. These include nausea, vomiting, pain, bleeding, infection, poor healing, hernia, or formation of adhesions. Unexpected reactions may occur from any drug or anesthetic given. Unintended injury may occur to other pelvic or abdominal structures such as Fallopian tubes, ovaries, bladder, ureter (tube from kidney to bladder), or bowel. Nerves going from the pelvis to the legs may be injured. Any such injury may require immediate or later additional surgery to correct the problem. Excessive blood loss requiring transfusion is very unlikely but possible. Dangerous blood clots may form in the legs or lungs. Physical and sexual activity will be restricted in varying degrees for an indeterminate period of time but most often 2-6 weeks.  Finally, she understands that it is impossible to list every possible undesirable effect and that the condition for which surgery is done is not always cured or significantly improved, and in rare cases may be even worse.Ample time was given to answer all questions.  Barnett Applebaum, MD, [facility], [facility] 01/18/2021  4:07 PM

## 2021-01-18 NOTE — Progress Notes (Signed)
PRE-OPERATIVE HISTORY AND PHYSICAL EXAM  HPI:  Rachel Brennan is a 36 y.o. G1P1001 Patient's last menstrual period was 12/25/2020.; she is being admitted for surgery related to abnormal uterine bleeding, fibroids, and pelvic pain.  Also has bladder pressure, frequency, and pain w voids most often during cycle.  [place]: Measurements: 9.6 x 5 9 x 6.3 cm = volume: 1862 mL. Uterus is anteverted. 1.7 x 1.6 x 1.4 cm intramural to submucosal fibroid present at the left mid uterine body. Additional 1.1 x 0.8 x 0.8 cm intramural fibroid present at the left lower uterine segment. Prominent 1.4 cm nabothian cyst noted at the cervix.  PMHx: Past Medical History:  Diagnosis Date   PCOS (polycystic ovarian syndrome)    UTI (lower urinary tract infection)    Past Surgical History:  Procedure Laterality Date   CESAREAN SECTION     LIPOSUCTION     Family History  Problem Relation Age of Onset   Hypertension Father    Anemia Mother    Hypertension Mother    Diabetes Maternal Grandmother    Social History   Tobacco Use   Smoking status: Former   Smokeless tobacco: Never  Scientific laboratory technician Use: Never used  Substance Use Topics   Alcohol use: Yes    Comment: twice per month   Drug use: No    Current Outpatient Medications:    fexofenadine-pseudoephedrine (ALLEGRA-D) 60-120 MG 12 hr tablet, Take 1 tablet by mouth 2 (two) times daily. (Patient not taking: Reported on 01/18/2021), Disp: 20 tablet, Rfl: 0   ibuprofen (ADVIL) 600 MG tablet, Take 1 tablet (600 mg total) by mouth every 8 (eight) hours as needed. (Patient not taking: Reported on 01/18/2021), Disp: 15 tablet, Rfl: 0   tranexamic acid (LYSTEDA) 650 MG TABS tablet, Take 2 tablets (1,300 mg total) by mouth 3 (three) times daily. Take at onset of period. Take during menses for a maximum of five days (Patient not taking: Reported on 01/18/2021), Disp: 30 tablet, Rfl: 2 Allergies: Patient has no known allergies.  Review of Systems   Constitutional:  Negative for chills, fever and malaise/fatigue.  HENT:  Negative for congestion, sinus pain and sore throat.   Eyes:  Negative for blurred vision and pain.  Respiratory:  Negative for cough and wheezing.   Cardiovascular:  Negative for chest pain and leg swelling.  Gastrointestinal:  Negative for abdominal pain, constipation, diarrhea, heartburn, nausea and vomiting.  Genitourinary:  Negative for dysuria, frequency, hematuria and urgency.  Musculoskeletal:  Negative for back pain, joint pain, myalgias and neck pain.  Skin:  Negative for itching and rash.  Neurological:  Negative for dizziness, tremors and weakness.  Endo/Heme/Allergies:  Does not bruise/bleed easily.  Psychiatric/Behavioral:  Negative for depression. The patient is not nervous/anxious and does not have insomnia.    Objective: BP 120/80    Ht 5\' 1"  (1.549 m)    Wt 169 lb (76.7 kg)    LMP 12/25/2020    BMI 31.93 kg/m   Filed Weights   01/18/21 1535  Weight: 169 lb (76.7 kg)   Physical Exam Constitutional:      General: She is not in acute distress.    Appearance: She is well-developed.  HENT:     Head: Normocephalic and atraumatic. No laceration.     Right Ear: Hearing normal.     Left Ear: Hearing normal.     Mouth/Throat:     Pharynx: Uvula midline.  Eyes:  Pupils: Pupils are equal, round, and reactive to light.  Neck:     Thyroid: No thyromegaly.  Cardiovascular:     Rate and Rhythm: Normal rate and regular rhythm.     Heart sounds: No murmur heard.   No friction rub. No gallop.  Pulmonary:     Effort: Pulmonary effort is normal. No respiratory distress.     Breath sounds: Normal breath sounds. No wheezing.  Abdominal:     General: Bowel sounds are normal. There is no distension.     Palpations: Abdomen is soft.     Tenderness: There is no abdominal tenderness. There is no rebound.  Musculoskeletal:        General: Normal range of motion.     Cervical back: Normal range of motion  and neck supple.  Neurological:     Mental Status: She is alert and oriented to person, place, and time.     Cranial Nerves: No cranial nerve deficit.  Skin:    General: Skin is warm and dry.  Psychiatric:        Judgment: Judgment normal.  Vitals reviewed.    Assessment: 1. Intramural and submucous leiomyoma of uterus   2. Menorrhagia with regular cycle   Plan TLH, BS, Cysto  I have had a careful discussion with this patient about all the options available and the risk/benefits of each. I have fully informed this patient that surgery may subject her to a variety of discomforts and risks: She understands that most patients have surgery with little difficulty, but problems can happen ranging from minor to fatal. These include nausea, vomiting, pain, bleeding, infection, poor healing, hernia, or formation of adhesions. Unexpected reactions may occur from any drug or anesthetic given. Unintended injury may occur to other pelvic or abdominal structures such as Fallopian tubes, ovaries, bladder, ureter (tube from kidney to bladder), or bowel. Nerves going from the pelvis to the legs may be injured. Any such injury may require immediate or later additional surgery to correct the problem. Excessive blood loss requiring transfusion is very unlikely but possible. Dangerous blood clots may form in the legs or lungs. Physical and sexual activity will be restricted in varying degrees for an indeterminate period of time but most often 2-6 weeks.  Finally, she understands that it is impossible to list every possible undesirable effect and that the condition for which surgery is done is not always cured or significantly improved, and in rare cases may be even worse.Ample time was given to answer all questions.  Barnett Applebaum, MD, [facility], [facility] 01/18/2021  4:07 PM

## 2021-01-18 NOTE — Patient Instructions (Signed)
Total Laparoscopic Hysterectomy, Care After The following information offers guidance on how to care for yourself after your procedure. Your health care provider may also give you more specific instructions. If you have problems or questions, contact your health care provider. What can I expect after the procedure? After the procedure, it is common to have: Pain, bruising, and numbness around your incisions. Tiredness (fatigue). Poor appetite. Less interest in sex. Vaginal discharge or bleeding. You will need to use a sanitary pad after this procedure. Feelings of sadness or other emotions. If your ovaries were also removed, it is also common to have symptoms of menopause, such as hot flashes, night sweats, and lack of sleep (insomnia). Follow these instructions at home: Medicines Take over-the-counter and prescription medicines only as told by your health care provider. Ask your health care provider if the medicine prescribed to you: Requires you to avoid driving or using machinery. Can cause constipation. You may need to take these actions to prevent or treat constipation: Drink enough fluid to keep your urine pale yellow. Take over-the-counter or prescription medicines. Eat foods that are high in fiber, such as beans, whole grains, and fresh fruits and vegetables. Limit foods that are high in fat and processed sugars, such as fried or sweet foods. Incision care  Follow instructions from your health care provider about how to take care of your incisions. Make sure you: Wash your hands with soap and water for at least 20 seconds before and after you change your bandage (dressing). If soap and water are not available, use hand sanitizer. Change your dressing as told by your health care provider. Leave stitches (sutures), skin glue, or adhesive strips in place. These skin closures may need to stay in place for 2 weeks or longer. If adhesive strip edges start to loosen and curl up, you may  trim the loose edges. Do not remove adhesive strips completely unless your health care provider tells you to do that. Check your incision areas every day for signs of infection. Check for: More redness, swelling, or pain. Fluid or blood. Warmth. Pus or a bad smell. Activity  Rest as told by your health care provider. Avoid sitting for a long time without moving. Get up to take short walks every 1-2 hours. This is important to improve blood flow and breathing. Ask for help if you feel weak or unsteady. Return to your normal activities as told by your health care provider. Ask your health care provider what activities are safe for you. Do not lift anything that is heavier than 10 lb (4.5 kg), or the limit that you are told, for one month after surgery or until your health care provider says that it is safe. If you were given a sedative during the procedure, it can affect you for several hours. Do not drive or operate machinery until your health care provider says that it is safe. Lifestyle Do not use any products that contain nicotine or tobacco. These products include cigarettes, chewing tobacco, and vaping devices, such as e-cigarettes. These can delay healing after surgery. If you need help quitting, ask your health care provider. Do not drink alcohol until your health care provider approves. General instructions  Do not douche, use tampons, or have sex for at least 6 weeks, or as told by your health care provider. If you struggle with physical or emotional changes after your procedure, speak with your health care provider or a therapist. Do not take baths, swim, or use a hot tub  until your health care provider approves. You may only be allowed to take showers for 2-3 weeks. Keep your dressing dry until your health care provider says it can be removed. Try to have someone at home with you for the first 1-2 weeks to help with your daily chores. Wear compression stockings as told by your health  care provider. These stockings help to prevent blood clots and reduce swelling in your legs. Keep all follow-up visits. This is important. Contact a health care provider if: You have any of these signs of infection: Chills or a fever. More redness, swelling, or pain around an incision. Fluid or blood coming from an incision. Warmth coming from an incision. Pus or a bad smell coming from an incision. An incision opens. You feel dizzy or light-headed. You have pain or bleeding when you urinate, or you are unable to urinate. You have abnormal vaginal discharge. You have pain that does not get better with medicine. Get help right away if: You have a fever and your symptoms suddenly get worse. You have severe abdominal pain. You have chest pain or shortness of breath. You faint. You have pain, swelling, or redness in your leg. You have heavy vaginal bleeding with blood clots, soaking through a sanitary pad in less than 1 hour. These symptoms may represent a serious problem that is an emergency. Do not wait to see if the symptoms will go away. Get medical help right away. Call your local emergency services (911 in the [place] Do not drive yourself to the hospital. Summary After the procedure, it is common to have pain and bruising around your incisions. Do not take baths, swim, or use a hot tub until your health care provider approves. Do not lift anything that is heavier than 10 lb (4.5 kg), or the limit that you are told, for one month after surgery or until your health care provider says that it is safe. Tell your health care provider if you have any signs or symptoms of infection after the procedure. Get help right away if you have severe abdominal pain, chest pain, shortness of breath, or heavy bleeding from your vagina. This information is not intended to replace advice given to you by your health care provider. Make sure you discuss any questions you have with your health care  provider. Document Revised: 08/23/2019 Document Reviewed: 08/23/2019 Elsevier Patient Education  [place].

## 2021-01-19 ENCOUNTER — Encounter: Payer: Self-pay | Admitting: Obstetrics & Gynecology

## 2021-01-19 ENCOUNTER — Other Ambulatory Visit
Admission: RE | Admit: 2021-01-19 | Discharge: 2021-01-19 | Disposition: A | Discharge status: Home / Self Care | Payer: Medicaid Other | Source: Ambulatory Visit | Attending: Obstetrics & Gynecology | Admitting: Obstetrics & Gynecology

## 2021-01-19 ENCOUNTER — Other Ambulatory Visit: Payer: Self-pay

## 2021-01-19 DIAGNOSIS — Z01812 Encounter for preprocedural laboratory examination: Secondary | ICD-10-CM | POA: Diagnosis not present

## 2021-01-19 DIAGNOSIS — N920 Excessive and frequent menstruation with regular cycle: Secondary | ICD-10-CM | POA: Insufficient documentation

## 2021-01-19 HISTORY — DX: Anemia, unspecified: D64.9

## 2021-01-19 LAB — CBC
HCT: 36.5 % (ref 36.0–46.0)
Hemoglobin: 11.5 g/dL — ABNORMAL LOW (ref 12.0–15.0)
MCH: 26.6 pg (ref 26.0–34.0)
MCHC: 31.5 g/dL (ref 30.0–36.0)
MCV: 84.5 fL (ref 80.0–100.0)
Platelets: 345 10*3/uL (ref 150–400)
RBC: 4.32 MIL/uL (ref 3.87–5.11)
RDW: 14.3 % (ref 11.5–15.5)
WBC: 8.8 10*3/uL (ref 4.0–10.5)
nRBC: 0 % (ref 0.0–0.2)

## 2021-01-19 LAB — TYPE AND SCREEN
ABO/RH(D): A POS
Antibody Screen: NEGATIVE

## 2021-01-19 NOTE — Patient Instructions (Addendum)
Your procedure is scheduled on: 01/26/21 - Tuesday Report to the Registration Desk on the 1st floor of the [facility]. To find out your arrival time, please call [phone] between 1PM - 3PM on: 01/25/21 - Monday  REMEMBER: Instructions that are not followed completely may result in serious medical risk, up to and including death; or upon the discretion of your surgeon and anesthesiologist your surgery may need to be rescheduled.  Do not eat food or drink any fluids after midnight the night before surgery.  No gum chewing, lozengers or hard candies.  TAKE THESE MEDICATIONS THE MORNING OF SURGERY WITH A SIP OF WATER: NONE  One week prior to surgery: Stop Anti-inflammatories (NSAIDS) such as Advil, Aleve, Ibuprofen, Motrin, Naproxen, Naprosyn and Aspirin based products such as Excedrin, Goodys Powder, BC Powder.  Stop ANY OVER THE COUNTER supplements until after surgery.  You may however, continue to take Tylenol if needed for pain up until the day of surgery.  No Alcohol for 24 hours before or after surgery.  No Smoking including e-cigarettes for 24 hours prior to surgery.  No chewable tobacco products for at least 6 hours prior to surgery.  No nicotine patches on the day of surgery.  Do not use any "recreational" drugs for at least a week prior to your surgery.  Please be advised that the combination of cocaine and anesthesia may have negative outcomes, up to and including death. If you test positive for cocaine, your surgery will be cancelled.  On the morning of surgery brush your teeth with toothpaste and water, you may rinse your mouth with mouthwash if you wish. Do not swallow any toothpaste or mouthwash.  Use CHG Soap or wipes as directed on instruction sheet.  Do not wear jewelry, make-up, hairpins, clips or nail polish.  Do not wear lotions, powders, or perfumes.   Do not shave body from the neck down 48 hours prior to surgery just in case you cut yourself which  could leave a site for infection.  Also, freshly shaved skin may become irritated if using the CHG soap.  Contact lenses, hearing aids and dentures may not be worn into surgery.  Do not bring valuables to the hospital. [facility] is not responsible for any missing/lost belongings or valuables.   Notify your doctor if there is any change in your medical condition (cold, fever, infection).  Wear comfortable clothing (specific to your surgery type) to the hospital.  After surgery, you can help prevent lung complications by doing breathing exercises.  Take deep breaths and cough every 1-2 hours. Your doctor may order a device called an Incentive Spirometer to help you take deep breaths. When coughing or sneezing, hold a pillow firmly against your incision with both hands. This is called splinting. Doing this helps protect your incision. It also decreases belly discomfort.  If you are being admitted to the hospital overnight, leave your suitcase in the car. After surgery it may be brought to your room.  If you are being discharged the day of surgery, you will not be allowed to drive home. You will need a responsible adult (18 years or older) to drive you home and stay with you that night.   If you are taking public transportation, you will need to have a responsible adult (18 years or older) with you. Please confirm with your physician that it is acceptable to use public transportation.   Please call the [facility] at [phone] if you have any  questions about these instructions.  Surgery Visitation Policy:  Patients undergoing a surgery or procedure may have one family member or support person with them as long as that person is not COVID-19 positive or experiencing its symptoms.  That person may remain in the waiting area during the procedure and may rotate out with other people.  Inpatient Visitation:    Visiting hours are 7 a.m. to 8 p.m. Up to two visitors  ages 16+ are allowed at one time in a patient room. The visitors may rotate out with other people during the day. Visitors must check out when they leave, or other visitors will not be allowed. One designated support person may remain overnight. The visitor must pass COVID-19 screenings, use hand sanitizer when entering and exiting the patients room and wear a mask at all times, including in the patients room. Patients must also wear a mask when staff or their visitor are in the room. Masking is required regardless of vaccination status.

## 2021-01-26 ENCOUNTER — Encounter: Payer: Self-pay | Admitting: Obstetrics & Gynecology

## 2021-01-26 ENCOUNTER — Other Ambulatory Visit: Payer: Self-pay

## 2021-01-26 ENCOUNTER — Ambulatory Visit
Admission: RE | Admit: 2021-01-26 | Discharge: 2021-01-26 | Disposition: A | Discharge status: Home / Self Care | Payer: Medicaid Other | Attending: Obstetrics & Gynecology | Admitting: Obstetrics & Gynecology

## 2021-01-26 ENCOUNTER — Ambulatory Visit: Payer: Medicaid Other | Admitting: Certified Registered"

## 2021-01-26 ENCOUNTER — Encounter
Admission: RE | Disposition: A | Discharge status: Home / Self Care | Payer: Self-pay | Source: Home / Self Care | Attending: Obstetrics & Gynecology

## 2021-01-26 DIAGNOSIS — D759 Disease of blood and blood-forming organs, unspecified: Secondary | ICD-10-CM | POA: Diagnosis not present

## 2021-01-26 DIAGNOSIS — D649 Anemia, unspecified: Secondary | ICD-10-CM | POA: Diagnosis not present

## 2021-01-26 DIAGNOSIS — D250 Submucous leiomyoma of uterus: Secondary | ICD-10-CM | POA: Insufficient documentation

## 2021-01-26 DIAGNOSIS — Z87891 Personal history of nicotine dependence: Secondary | ICD-10-CM | POA: Diagnosis not present

## 2021-01-26 DIAGNOSIS — N888 Other specified noninflammatory disorders of cervix uteri: Secondary | ICD-10-CM | POA: Diagnosis not present

## 2021-01-26 DIAGNOSIS — N8003 Adenomyosis of the uterus: Secondary | ICD-10-CM | POA: Diagnosis not present

## 2021-01-26 DIAGNOSIS — N879 Dysplasia of cervix uteri, unspecified: Secondary | ICD-10-CM | POA: Diagnosis not present

## 2021-01-26 DIAGNOSIS — D251 Intramural leiomyoma of uterus: Secondary | ICD-10-CM | POA: Diagnosis not present

## 2021-01-26 DIAGNOSIS — N838 Other noninflammatory disorders of ovary, fallopian tube and broad ligament: Secondary | ICD-10-CM | POA: Diagnosis not present

## 2021-01-26 DIAGNOSIS — N920 Excessive and frequent menstruation with regular cycle: Secondary | ICD-10-CM | POA: Diagnosis present

## 2021-01-26 DIAGNOSIS — N946 Dysmenorrhea, unspecified: Secondary | ICD-10-CM | POA: Diagnosis present

## 2021-01-26 HISTORY — PX: CYSTOSCOPY: SHX5120

## 2021-01-26 HISTORY — PX: TOTAL LAPAROSCOPIC HYSTERECTOMY WITH SALPINGECTOMY: SHX6742

## 2021-01-26 LAB — POCT PREGNANCY, URINE: Preg Test, Ur: NEGATIVE

## 2021-01-26 SURGERY — HYSTERECTOMY, TOTAL, LAPAROSCOPIC, WITH SALPINGECTOMY
Anesthesia: General | Site: Vagina | Laterality: Bilateral

## 2021-01-26 MED ORDER — SODIUM CHLORIDE 0.9 % IV SOLN: INTRAVENOUS | Status: DC | PRN

## 2021-01-26 MED ORDER — SODIUM CHLORIDE 0.9 % IV SOLN
INTRAVENOUS | Status: AC
  Filled 2021-01-26: qty 2

## 2021-01-26 MED ORDER — PROPOFOL 10 MG/ML IV BOLUS
INTRAVENOUS | Status: DC | PRN
  Administered 2021-01-26: 250 mg via INTRAVENOUS

## 2021-01-26 MED ORDER — LACTATED RINGERS IR SOLN
Status: DC | PRN
  Administered 2021-01-26: 200 mL
  Administered 2021-01-26: 50 mL

## 2021-01-26 MED ORDER — LACTATED RINGERS IV SOLN: INTRAVENOUS | Status: DC

## 2021-01-26 MED ORDER — SUGAMMADEX SODIUM 500 MG/5ML IV SOLN
INTRAVENOUS | Status: DC | PRN
  Administered 2021-01-26: 200 mg via INTRAVENOUS

## 2021-01-26 MED ORDER — ONDANSETRON HCL 4 MG/2ML IJ SOLN
INTRAMUSCULAR | Status: DC | PRN
  Administered 2021-01-26: 4 mg via INTRAVENOUS

## 2021-01-26 MED ORDER — DEXAMETHASONE SODIUM PHOSPHATE 10 MG/ML IJ SOLN
INTRAMUSCULAR | Status: DC | PRN
  Administered 2021-01-26: 10 mg via INTRAVENOUS

## 2021-01-26 MED ORDER — LIDOCAINE HCL (PF) 2 % IJ SOLN
INTRAMUSCULAR | Status: AC
  Filled 2021-01-26: qty 5

## 2021-01-26 MED ORDER — HEMOSTATIC AGENTS (NO CHARGE) OPTIME
TOPICAL | Status: DC | PRN
  Administered 2021-01-26: 1

## 2021-01-26 MED ORDER — LACTATED RINGERS IV SOLN: INTRAVENOUS | Status: DC | PRN

## 2021-01-26 MED ORDER — ROCURONIUM BROMIDE 100 MG/10ML IV SOLN
INTRAVENOUS | Status: DC | PRN
  Administered 2021-01-26: 50 mg via INTRAVENOUS
  Administered 2021-01-26: 10 mg via INTRAVENOUS

## 2021-01-26 MED ORDER — FENTANYL CITRATE (PF) 100 MCG/2ML IJ SOLN
INTRAMUSCULAR | Status: AC
  Filled 2021-01-26: qty 2

## 2021-01-26 MED ORDER — HYDROMORPHONE HCL 1 MG/ML IJ SOLN
INTRAMUSCULAR | Status: AC
  Filled 2021-01-26: qty 1

## 2021-01-26 MED ORDER — SODIUM CHLORIDE 0.9 % IV SOLN
2.0000 g | INTRAVENOUS | Status: AC
  Administered 2021-01-26: 08:00:00 2 g via INTRAVENOUS

## 2021-01-26 MED ORDER — OXYCODONE HCL 5 MG/5ML PO SOLN: 5.0000 mg | Freq: Once | ORAL | Status: DC | PRN

## 2021-01-26 MED ORDER — MIDAZOLAM HCL 2 MG/2ML IJ SOLN
INTRAMUSCULAR | Status: AC
  Filled 2021-01-26: qty 2

## 2021-01-26 MED ORDER — ORAL CARE MOUTH RINSE: 15.0000 mL | Freq: Once | OROMUCOSAL | Status: AC

## 2021-01-26 MED ORDER — HYDROCODONE-ACETAMINOPHEN 5-325 MG PO TABS: 1.0000 | ORAL_TABLET | ORAL | 0 refills | Status: DC | PRN

## 2021-01-26 MED ORDER — BUPIVACAINE HCL (PF) 0.5 % IJ SOLN
INTRAMUSCULAR | Status: AC
  Filled 2021-01-26: qty 30

## 2021-01-26 MED ORDER — MIDAZOLAM HCL 2 MG/2ML IJ SOLN
INTRAMUSCULAR | Status: DC | PRN
  Administered 2021-01-26: 2 mg via INTRAVENOUS

## 2021-01-26 MED ORDER — IBUPROFEN 600 MG PO TABS: 600.0000 mg | ORAL_TABLET | Freq: Three times a day (TID) | ORAL | 1 refills | Status: DC | PRN

## 2021-01-26 MED ORDER — OXYCODONE-ACETAMINOPHEN 5-325 MG PO TABS: 1.0000 | ORAL_TABLET | ORAL | 0 refills | Status: DC | PRN

## 2021-01-26 MED ORDER — BUPIVACAINE HCL (PF) 0.5 % IJ SOLN
INTRAMUSCULAR | Status: DC | PRN
  Administered 2021-01-26: 12 mL

## 2021-01-26 MED ORDER — HYDROMORPHONE HCL 1 MG/ML IJ SOLN
INTRAMUSCULAR | Status: DC | PRN
  Administered 2021-01-26: 1 mg via INTRAVENOUS

## 2021-01-26 MED ORDER — ONDANSETRON HCL 4 MG/2ML IJ SOLN
INTRAMUSCULAR | Status: AC
  Filled 2021-01-26: qty 2

## 2021-01-26 MED ORDER — CHLORHEXIDINE GLUCONATE 0.12 % MT SOLN: 15.0000 mL | Freq: Once | OROMUCOSAL | Status: AC

## 2021-01-26 MED ORDER — OXYCODONE HCL 5 MG PO TABS: 5.0000 mg | ORAL_TABLET | Freq: Once | ORAL | Status: DC | PRN

## 2021-01-26 MED ORDER — FENTANYL CITRATE (PF) 100 MCG/2ML IJ SOLN
25.0000 ug | INTRAMUSCULAR | Status: DC | PRN
  Administered 2021-01-26 (×2): 25 ug via INTRAVENOUS

## 2021-01-26 MED ORDER — MORPHINE SULFATE (PF) 2 MG/ML IV SOLN: 1.0000 mg | INTRAVENOUS | Status: DC | PRN

## 2021-01-26 MED ORDER — LIDOCAINE HCL (CARDIAC) PF 100 MG/5ML IV SOSY
PREFILLED_SYRINGE | INTRAVENOUS | Status: DC | PRN
  Administered 2021-01-26: 100 mg via INTRAVENOUS

## 2021-01-26 MED ORDER — PROPOFOL 500 MG/50ML IV EMUL
INTRAVENOUS | Status: AC
  Filled 2021-01-26: qty 50

## 2021-01-26 MED ORDER — FAMOTIDINE 20 MG PO TABS
ORAL_TABLET | ORAL | Status: AC
  Administered 2021-01-26: 07:00:00 20 mg via ORAL
  Filled 2021-01-26: qty 1

## 2021-01-26 MED ORDER — ACETAMINOPHEN 10 MG/ML IV SOLN: 1000.0000 mg | Freq: Once | INTRAVENOUS | Status: DC | PRN

## 2021-01-26 MED ORDER — CHLORHEXIDINE GLUCONATE 0.12 % MT SOLN
OROMUCOSAL | Status: AC
  Administered 2021-01-26: 07:00:00 15 mL via OROMUCOSAL
  Filled 2021-01-26: qty 15

## 2021-01-26 MED ORDER — HYDROCODONE-ACETAMINOPHEN 5-325 MG PO TABS: 1.0000 | ORAL_TABLET | ORAL | Status: DC | PRN

## 2021-01-26 MED ORDER — PHENYLEPHRINE HCL-NACL 20-0.9 MG/250ML-% IV SOLN
INTRAVENOUS | Status: AC
  Filled 2021-01-26: qty 250

## 2021-01-26 MED ORDER — FAMOTIDINE 20 MG PO TABS: 20.0000 mg | ORAL_TABLET | Freq: Once | ORAL | Status: AC

## 2021-01-26 MED ORDER — ACETAMINOPHEN 650 MG RE SUPP
650.0000 mg | RECTAL | Status: DC | PRN
  Filled 2021-01-26: qty 1

## 2021-01-26 MED ORDER — FENTANYL CITRATE (PF) 100 MCG/2ML IJ SOLN
INTRAMUSCULAR | Status: DC | PRN
  Administered 2021-01-26: 100 ug via INTRAVENOUS

## 2021-01-26 MED ORDER — ONDANSETRON HCL 4 MG/2ML IJ SOLN
4.0000 mg | Freq: Once | INTRAMUSCULAR | Status: AC | PRN
  Administered 2021-01-26: 10:00:00 4 mg via INTRAVENOUS

## 2021-01-26 MED ORDER — ACETAMINOPHEN 325 MG PO TABS: 650.0000 mg | ORAL_TABLET | ORAL | Status: DC | PRN

## 2021-01-26 MED ORDER — POVIDONE-IODINE 10 % EX SWAB
2.0000 "application " | Freq: Once | CUTANEOUS | Status: AC
  Administered 2021-01-26: 2 via TOPICAL

## 2021-01-26 MED ORDER — 0.9 % SODIUM CHLORIDE (POUR BTL) OPTIME
TOPICAL | Status: DC | PRN
  Administered 2021-01-26: 08:00:00 50 mL

## 2021-01-26 MED ORDER — OXYCODONE-ACETAMINOPHEN 5-325 MG PO TABS: 1.0000 | ORAL_TABLET | ORAL | Status: DC | PRN

## 2021-01-26 SURGICAL SUPPLY — 64 items
ADH SKN CLS APL DERMABOND .7 (GAUZE/BANDAGES/DRESSINGS) ×2
APL PRP STRL LF DISP 70% ISPRP (MISCELLANEOUS) ×2
APL SRG 38 LTWT LNG FL B (MISCELLANEOUS) ×2
APPLICATOR ARISTA FLEXITIP XL (MISCELLANEOUS) ×2 IMPLANT
BAG DRN RND TRDRP ANRFLXCHMBR (UROLOGICAL SUPPLIES) ×2
BAG LAPAROSCOPIC 12 15 PORT 16 (BASKET) IMPLANT
BAG RETRIEVAL 12/15 (BASKET)
BAG RETRIEVAL 12/15MM (BASKET)
BAG URINE DRAIN 2000ML AR STRL (UROLOGICAL SUPPLIES) ×4 IMPLANT
BLADE SURG SZ11 CARB STEEL (BLADE) ×4 IMPLANT
CATH FOLEY 2WAY  5CC 16FR (CATHETERS) ×2
CATH FOLEY 2WAY 5CC 16FR (CATHETERS) ×2
CATH URTH 16FR FL 2W BLN LF (CATHETERS) ×2 IMPLANT
CHLORAPREP W/TINT 26 (MISCELLANEOUS) ×4 IMPLANT
DEFOGGER SCOPE WARMER CLEARIFY (MISCELLANEOUS) ×4 IMPLANT
DERMABOND ADVANCED (GAUZE/BANDAGES/DRESSINGS) ×2
DERMABOND ADVANCED .7 DNX12 (GAUZE/BANDAGES/DRESSINGS) ×2 IMPLANT
DEVICE SUTURE ENDOST 10MM (ENDOMECHANICALS) ×2 IMPLANT
DRAPE CAMERA CLOSED 9X96 (DRAPES) ×4 IMPLANT
DRSG TEGADERM 2-3/8X2-3/4 SM (GAUZE/BANDAGES/DRESSINGS) ×6 IMPLANT
GAUZE 4X4 16PLY ~~LOC~~+RFID DBL (SPONGE) ×8 IMPLANT
GLOVE SURG ENC MOIS LTX SZ8 (GLOVE) ×12 IMPLANT
GLOVE SURG UNDER LTX SZ8 (GLOVE) ×12 IMPLANT
GOWN STRL REUS W/ TWL LRG LVL3 (GOWN DISPOSABLE) ×8 IMPLANT
GOWN STRL REUS W/ TWL XL LVL3 (GOWN DISPOSABLE) ×6 IMPLANT
GOWN STRL REUS W/TWL LRG LVL3 (GOWN DISPOSABLE) ×16
GOWN STRL REUS W/TWL XL LVL3 (GOWN DISPOSABLE) ×12
GRASPER SUT TROCAR 14GX15 (MISCELLANEOUS) ×4 IMPLANT
HEMOSTAT ARISTA ABSORB 3G PWDR (HEMOSTASIS) ×2 IMPLANT
IRRIGATION STRYKERFLOW (MISCELLANEOUS) ×2 IMPLANT
IRRIGATOR STRYKERFLOW (MISCELLANEOUS) ×4
IV LACTATED RINGERS 1000ML (IV SOLUTION) ×8 IMPLANT
KIT PINK PAD W/HEAD ARE REST (MISCELLANEOUS) ×4
KIT PINK PAD W/HEAD ARM REST (MISCELLANEOUS) ×2 IMPLANT
KIT TURNOVER CYSTO (KITS) ×4 IMPLANT
LABEL OR SOLS (LABEL) ×4 IMPLANT
MANIFOLD NEPTUNE II (INSTRUMENTS) ×4 IMPLANT
MANIPULATOR VCARE LG CRV RETR (MISCELLANEOUS) ×2 IMPLANT
MANIPULATOR VCARE SML CRV RETR (MISCELLANEOUS) IMPLANT
MANIPULATOR VCARE STD CRV RETR (MISCELLANEOUS) IMPLANT
NEEDLE VERESS 14GA 120MM (NEEDLE) ×4 IMPLANT
NS IRRIG 500ML POUR BTL (IV SOLUTION) ×4 IMPLANT
OCCLUDER COLPOPNEUMO (BALLOONS) ×4 IMPLANT
PACK GYN LAPAROSCOPIC (MISCELLANEOUS) ×4 IMPLANT
PAD OB MATERNITY 4.3X12.25 (PERSONAL CARE ITEMS) ×4 IMPLANT
PAD PREP 24X41 OB/GYN DISP (PERSONAL CARE ITEMS) ×4 IMPLANT
SCISSORS METZENBAUM CVD 33 (INSTRUMENTS) ×2 IMPLANT
SCRUB EXIDINE 4% CHG 4OZ (MISCELLANEOUS) ×4 IMPLANT
SET CYSTO W/LG BORE CLAMP LF (SET/KITS/TRAYS/PACK) ×4 IMPLANT
SET TRI-LUMEN FLTR TB AIRSEAL (TUBING) ×4 IMPLANT
SHEARS HARMONIC ACE PLUS 36CM (ENDOMECHANICALS) ×4 IMPLANT
SLEEVE ENDOPATH XCEL 5M (ENDOMECHANICALS) ×4 IMPLANT
SPONGE GAUZE 2X2 8PLY STER LF (GAUZE/BANDAGES/DRESSINGS) ×1
SPONGE GAUZE 2X2 8PLY STRL LF (GAUZE/BANDAGES/DRESSINGS) ×1 IMPLANT
SURGILUBE 2OZ TUBE FLIPTOP (MISCELLANEOUS) ×4 IMPLANT
SUT ENDO VLOC 180-0-8IN (SUTURE) ×2 IMPLANT
SUT VIC AB 0 CT1 36 (SUTURE) ×4 IMPLANT
SUT VIC AB 3-0 SH 27 (SUTURE) ×4
SUT VIC AB 3-0 SH 27X BRD (SUTURE) IMPLANT
SUT VIC AB 4-0 FS2 27 (SUTURE) ×4 IMPLANT
SYR 10ML LL (SYRINGE) ×4 IMPLANT
SYR 50ML LL SCALE MARK (SYRINGE) ×4 IMPLANT
TROCAR PORT AIRSEAL 8X100 (TROCAR) ×4 IMPLANT
TROCAR XCEL NON-BLD 5MMX100MML (ENDOMECHANICALS) ×4 IMPLANT

## 2021-01-26 NOTE — Anesthesia Procedure Notes (Signed)
Procedure Name: Intubation Date/Time: 01/26/2021 7:34 AM Performed by: Beverely Low, CRNA Pre-anesthesia Checklist: Patient identified, Patient being monitored, Timeout performed, Emergency Drugs available and Suction available Patient Re-evaluated:Patient Re-evaluated prior to induction Oxygen Delivery Method: Circle system utilized Preoxygenation: Pre-oxygenation with 100% oxygen Induction Type: IV induction Ventilation: Mask ventilation without difficulty Laryngoscope Size: Mac, 3 and McGraph Grade View: Grade I Tube type: Oral Tube size: 7.0 mm Number of attempts: 1 Airway Equipment and Method: Stylet Placement Confirmation: ETT inserted through vocal cords under direct vision, positive ETCO2 and breath sounds checked- equal and bilateral Secured at: 21 cm Tube secured with: Tape Dental Injury: Teeth and Oropharynx as per pre-operative assessment

## 2021-01-26 NOTE — Discharge Instructions (Signed)

## 2021-01-26 NOTE — Interval H&P Note (Signed)
History and Physical Interval Note:  01/26/2021 7:06 AM  Rachel Brennan  has presented today for surgery, with the diagnosis of Menorrhagia with regular cycle N92.0 Intramural and submucous leiomyoma of uterus D25.1.  The various methods of treatment have been discussed with the patient and family. After consideration of risks, benefits and other options for treatment, the patient has consented to  Procedure(s): TOTAL LAPAROSCOPIC HYSTERECTOMY WITH SALPINGECTOMY (Bilateral) CYSTOSCOPY (Bilateral) as a surgical intervention.  The patient's history has been reviewed, patient examined, no change in status, stable for surgery.  I have reviewed the patient's chart and labs.  Questions were answered to the patient's satisfaction.     Hoyt Koch

## 2021-01-26 NOTE — Anesthesia Postprocedure Evaluation (Signed)
Anesthesia Post Note  Patient: Rachel Brennan  Procedure(s) Performed: TOTAL LAPAROSCOPIC HYSTERECTOMY WITH SALPINGECTOMY (Bilateral: Abdomen) CYSTOSCOPY (Bilateral: Vagina )  Patient location during evaluation: PACU Anesthesia Type: General Level of consciousness: awake and alert, oriented and patient cooperative Pain management: pain level controlled Vital Signs Assessment: post-procedure vital signs reviewed and stable Respiratory status: spontaneous breathing, nonlabored ventilation and respiratory function stable Cardiovascular status: blood pressure returned to baseline and stable Postop Assessment: adequate PO intake Anesthetic complications: no   No notable events documented.   Last Vitals:  Vitals:   01/26/21 1021 01/26/21 1111  BP: 129/82 132/83  Pulse: 69 75  Resp: 16 16  Temp: (!) 36.1 C   SpO2: 98% 100%    Last Pain:  Vitals:   01/26/21 1111  TempSrc:   PainSc: 0-No pain                 Darrin Nipper

## 2021-01-26 NOTE — Anesthesia Preprocedure Evaluation (Addendum)
Anesthesia Evaluation  Patient identified by MRN, date of birth, ID band Patient awake    Reviewed: Allergy & Precautions, NPO status , Patient's Chart, lab work & pertinent test results  History of Anesthesia Complications Negative for: history of anesthetic complications  Airway Mallampati: I   Neck ROM: Full    Dental no notable dental hx.    Pulmonary former smoker (quit in 68s),    Pulmonary exam normal breath sounds clear to auscultation       Cardiovascular Exercise Tolerance: Good negative cardio ROS Normal cardiovascular exam Rhythm:Regular Rate:Normal     Neuro/Psych negative neurological ROS     GI/Hepatic GERD  Controlled,  Endo/Other  Obesity, PCOS  Renal/GU negative Renal ROS     Musculoskeletal   Abdominal   Peds  Hematology  (+) Blood dyscrasia, anemia ,   Anesthesia Other Findings   Reproductive/Obstetrics Fibroids                            Anesthesia Physical Anesthesia Plan  ASA: 3  Anesthesia Plan: General   Post-op Pain Management:    Induction: Intravenous  PONV Risk Score and Plan: 3 and Ondansetron, Dexamethasone and Treatment may vary due to age or medical condition  Airway Management Planned: Oral ETT  Additional Equipment:   Intra-op Plan:   Post-operative Plan: Extubation in OR  Informed Consent: I have reviewed the patients History and Physical, chart, labs and discussed the procedure including the risks, benefits and alternatives for the proposed anesthesia with the patient or authorized representative who has indicated his/her understanding and acceptance.     Dental advisory given  Plan Discussed with: CRNA  Anesthesia Plan Comments: (Patient consented for risks of anesthesia including but not limited to:  - adverse reactions to medications - damage to eyes, teeth, lips or other oral mucosa - nerve damage due to positioning  - sore  throat or hoarseness - damage to heart, brain, nerves, lungs, other parts of body or loss of life  Informed patient about role of CRNA in peri- and intra-operative care.  Patient voiced understanding.)        Anesthesia Quick Evaluation

## 2021-01-26 NOTE — Op Note (Signed)
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  ANESTHESIA: General endotracheal anesthesia  ESTIMATED BLOOD LOSS: less than 50   SPECIMENS: Uterus, Tubes.  COMPLICATIONS: None  DISPOSITION: stable to PACU  FINDINGS: Intraabdominal adhesions were not noted. Normal ovaries. Fibroid uterus.  PROCEDURE:  The patient was taken to the OR where anesthesia was administed. She was prepped and draped in the normal sterile fashion in the dorsal lithotomy position in the [place] stirrups. A time out was performed. A Graves speculum was inserted, the cervix was grasped with a single tooth tenaculum and the endometrial cavity was sounded. The cervix was progressively dilated to a size 18 [place] with [employer] dilators. A V-Care uterine manipulator was inserted in the usual fashion without incident. Gloves were changed and attention was turned to the abdomen.   An left upper quadrant [place] transverse 36mm skin incision was made with the scalpel after local anesthesia applied to the skin. A Veress-step needle was inserted in the usual fashion and confirmed using the hanging drop technique. A pneumoperitoneum was obtained by insufflation of CO2 (opening pressure of 57mmHg) to 70mmHg. An 5 mm trocar is then placed under direct visualization with the laparoscope. A diagnostic laparoscopy was performed yielding the previously described findings. Attention was turned to the left lower quadrant where after visualization of the inferior epigastric vessels a 39mm skin incision was made with the  scalpel. A 5 mm laparoscopic port was inserted. The same procedure was repeated in the right lower quadrant with a 8 mm trocar. Attention was turned to the left aspect of the uterus, where after visualization of the ureter, the round ligament was coagulated and transected using the 96mm Harmonic Scapel. The anterior and posterior leafs of the broad ligament were dissected off as the anterior one was coagulated and transected in a caudal direction towards the cuff of the uterine manipulator.  Attention was then turned to the left fallopian tube which was recognized by visualization of the fimbria. The tube is excised to its attachment to the uterus. The uterine-ovarian ligament and its blood vessels were carefully coagulated and transected using the Harmonic scapel.  Attention was turned to the right aspect of the uterus where the same procedure was performed.  The vesicouterine reflection of the peritoneum was dissected with the harmonic scapel and the bladder flap was created bluntly.  The uterine vessels were coagulated and transected bilaterally using first bipolar cautery and then the harmonic scapel. A 360 degree, circumferential colpotomy was done to completely amputate the uterus with cervix and tubes. Once the specimen was amputated it was delivered through the vagina.   The colpotomy was repaired in a simple running fashion using a delayed absorbable suture with an endo-stitch device.  Vaginal exam confirms complete closure.  The cavity was copiously irrigated. A survey of the pelvic cavity revealed adequate hemostasis and no injury to bowel, bladder, or ureter.  The assistance of my assisting-physician was vital to resect and retract interchangably with self on each side.   A diagnostic cystoscopy was performed using saline distension of bladder with no lesions or injuries noted.  Bilateral urine flow from each ureteral orifice is visualized.  At  this point the procedure was finalized. The umbilical  fascia incision is closed with a vicryl suture using the fascia closure device. All the instruments were removed from the patient's body. Gas was expelled and patient is leveled.  Incisions are closed with skin adhesive.    Patient goes to recovery room in stable condition.  All sponge, instrument, and needle counts are correct x2.     Barnett Applebaum, MD, [facility], [facility] 01/26/2021  9:15 AM

## 2021-01-26 NOTE — Transfer of Care (Signed)
Immediate Anesthesia Transfer of Care Note  Patient: Rachel Brennan  Procedure(s) Performed: TOTAL LAPAROSCOPIC HYSTERECTOMY WITH SALPINGECTOMY (Bilateral: Abdomen) CYSTOSCOPY (Bilateral: Vagina )  Patient Location: PACU  Anesthesia Type:General  Level of Consciousness: sedated  Airway & Oxygen Therapy: Patient Spontanous Breathing  Post-op Assessment: Post -op Vital signs reviewed and stable  Post vital signs: Reviewed and stable  Last Vitals:  Vitals Value Taken Time  BP 127/86 01/26/21 0918  Temp    Pulse 82 01/26/21 0923  Resp 17 01/26/21 0923  SpO2 100 % 01/26/21 0923  Vitals shown include unvalidated device data.  Last Pain:  Vitals:   01/26/21 0639  TempSrc: Oral  PainSc: 1          Complications: No notable events documented.

## 2021-01-27 ENCOUNTER — Encounter: Payer: Self-pay | Admitting: Obstetrics & Gynecology

## 2021-01-27 LAB — SURGICAL PATHOLOGY

## 2021-02-10 ENCOUNTER — Other Ambulatory Visit: Payer: Self-pay

## 2021-02-10 ENCOUNTER — Encounter: Payer: Self-pay | Admitting: Obstetrics & Gynecology

## 2021-02-10 ENCOUNTER — Ambulatory Visit (INDEPENDENT_AMBULATORY_CARE_PROVIDER_SITE_OTHER): Payer: Medicaid Other | Admitting: Obstetrics & Gynecology

## 2021-02-10 VITALS — BP 102/72 | Ht 61.0 in | Wt 169.0 lb

## 2021-02-10 DIAGNOSIS — Z90710 Acquired absence of both cervix and uterus: Secondary | ICD-10-CM

## 2021-02-10 NOTE — Progress Notes (Signed)
°  Postoperative Follow-up Patient presents post op from  [facility]  for abnormal uterine bleeding and fibroids, 2 weeks ago. Path DIAGNOSIS:  A. UTERUS WITH CERVIX AND BILATERAL FALLOPIAN TUBES; TOTAL HYSTERECTOMY  WITH BILATERAL SALPINGECTOMY:  - CERVIX WITH MICROGLANDULAR HYPERPLASIA AND NABOTHIAN CYSTS.  - SECRETORY ENDOMETRIUM.  - MYOMETRIUM WITH ADENOMYOSIS AND LEIOMYOMATA, LARGEST MEASURING 2.7 CM.  - FIBROUS SEROSAL ADHESIONS.  - BILATERAL FALLOPIAN TUBES WITH FIMBRIATED END AND BENIGN PARATUBAL  CYSTS.  - NEGATIVE FOR ATYPIA AND MALIGNANCY.   Subjective: Patient reports marked improvement in her preop symptoms. Eating a regular diet without difficulty. The patient is not having any pain.  Activity: normal activities of daily living. Patient reports additional symptom's since surgery of None.  Objective: BP 102/72    Ht 5\' 1"  (1.549 m)    Wt 169 lb (76.7 kg)    LMP 01/26/2021    BMI 31.93 kg/m  Physical Exam Constitutional:      General: She is not in acute distress.    Appearance: She is well-developed.  Cardiovascular:     Rate and Rhythm: Normal rate.  Pulmonary:     Effort: Pulmonary effort is normal.  Abdominal:     General: There is no distension.     Palpations: Abdomen is soft.     Tenderness: There is no abdominal tenderness.     Comments: Incision Healing Well   Musculoskeletal:        General: Normal range of motion.  Neurological:     Mental Status: She is alert and oriented to person, place, and time.     Cranial Nerves: No cranial nerve deficit.  Skin:    General: Skin is warm and dry.    Assessment: s/p :  total laparoscopic hysterectomy with bilateral salpingectomy stable  Plan: Patient has done well after surgery with no apparent complications.  I have discussed the post-operative course to date, and the expected progress moving forward.  The patient understands what complications to be concerned about.  I will see the patient in routine follow up,  or sooner if needed.    Activity plan: No restriction except Pelvic rest.  Hoyt Koch 02/10/2021, 2:43 PM

## 2021-03-10 ENCOUNTER — Encounter: Payer: Medicaid Other | Admitting: Obstetrics & Gynecology

## 2021-03-15 ENCOUNTER — Encounter: Payer: Self-pay | Admitting: Obstetrics & Gynecology

## 2021-03-15 ENCOUNTER — Other Ambulatory Visit: Payer: Self-pay

## 2021-03-15 ENCOUNTER — Ambulatory Visit (INDEPENDENT_AMBULATORY_CARE_PROVIDER_SITE_OTHER): Payer: Medicaid Other | Admitting: Obstetrics & Gynecology

## 2021-03-15 VITALS — BP 120/80 | Ht 61.0 in | Wt 172.0 lb

## 2021-03-15 DIAGNOSIS — Z90710 Acquired absence of both cervix and uterus: Secondary | ICD-10-CM

## 2021-03-15 NOTE — Progress Notes (Signed)
?  Postoperative Follow-up ?Patient presents post op from  [facility]  for abnormal uterine bleeding and fibroids, 6 weeks ago. ? ?Subjective: ?Patient reports marked improvement in her preop symptoms. Eating a regular diet without difficulty. The patient is not having any pain.  Activity: normal activities of daily living. Patient reports additional symptom's since surgery of No bleeding. ? ?Objective: ?BP 120/80   Ht '5\' 1"'$  (1.549 m)   Wt 172 lb (78 kg)   LMP 01/26/2021   BMI 32.50 kg/m?  ?Physical Exam ?Constitutional:   ?   General: She is not in acute distress. ?   Appearance: She is well-developed.  ?Genitourinary:  ?   Rectum normal.  ?   Genitourinary Comments: Cervix and uterus absent. ?Vaginal cuff healing well.  ?   No vaginal erythema or bleeding.  ? ?   Right Adnexa: not tender and no mass present. ?   Left Adnexa: not tender and no mass present. ?   Cervix is absent.  ?   Uterus is absent.  ?Cardiovascular:  ?   Rate and Rhythm: Normal rate.  ?Pulmonary:  ?   Effort: Pulmonary effort is normal.  ?Abdominal:  ?   General: There is no distension.  ?   Palpations: Abdomen is soft.  ?   Tenderness: There is no abdominal tenderness.  ?   Comments: Incision healing well.  ?Musculoskeletal:     ?   General: Normal range of motion.  ?Neurological:  ?   Mental Status: She is alert and oriented to person, place, and time.  ?   Cranial Nerves: No cranial nerve deficit.  ?Skin: ?   General: Skin is warm and dry.  ? ? ?Assessment: ?s/p :  total laparoscopic hysterectomy with bilateral salpingectomy progressing well ? ?Plan: ?Patient has done well after surgery with no apparent complications.  I have discussed the post-operative course to date, and the expected progress moving forward.  The patient understands what complications to be concerned about.  I will see the patient in routine follow up, or sooner if needed.   ? ?Activity plan: No restriction. ? ?Hoyt Koch ?03/15/2021, 2:20 PM ? ? ?

## 2021-04-15 ENCOUNTER — Ambulatory Visit: Payer: Medicaid Other | Admitting: Dermatology

## 2022-06-07 NOTE — Progress Notes (Unsigned)
Rachel Amy, PA-C [address]  [address]  [place], [place] [address]  Main: [phone]  Fax: [phone]   Gastroenterology Consultation  Referring Provider:     Shane Crutch, [place] Primary Care Physician:  Shane Crutch, [place] Primary Gastroenterologist:  Rachel Amy, PA-C / Dr. Wyline Mood  Reason for Consultation:     Constipation        HPI:   Rachel Brennan is a 37 y.o. y/o female referred for consultation & management  by Shane Crutch, PA.    Here to evaluate constipation.  Patient states she has had increasing constipation for 1 year, since her hysterectomy.  She is having small hard balls of stool with straining.  Bowel movement once per week.  She has developed show severe sharp rectal pains prior to bowel movements.  Has had some rectal bleeding in the past but not recently.  She denies abdominal pain, weight loss, or family history of colon cancer.  Her father had precancerous colon polyps.  She has tried numerous OTC treatments including fiber, stool softeners, laxatives with little benefit.  She is drinking more water and eating more fiber.  No previous GI evaluation or colonoscopy.  Had total abdominal hysterectomy 01/2021 for menorrhagia.  Past Medical History:  Diagnosis Date   Anemia    PCOS (polycystic ovarian syndrome)    UTI (lower urinary tract infection)     Past Surgical History:  Procedure Laterality Date   BREAST SURGERY     CESAREAN SECTION     CYSTOSCOPY Bilateral 01/26/2021   Procedure: CYSTOSCOPY;  Surgeon: Nadara Mustard, MD;  Location: [facility] ORS;  Service: Gynecology;  Laterality: Bilateral;   LIPOSUCTION     TOTAL LAPAROSCOPIC HYSTERECTOMY WITH SALPINGECTOMY Bilateral 01/26/2021   Procedure: TOTAL LAPAROSCOPIC HYSTERECTOMY WITH SALPINGECTOMY;  Surgeon: Nadara Mustard, MD;  Location: [facility] ORS;  Service: Gynecology;  Laterality: Bilateral;   WISDOM TOOTH EXTRACTION  2003    Prior to Admission medications   Not on File     Family History  Problem Relation Age of Onset   Hypertension Father    Anemia Mother    Hypertension Mother    Diabetes Maternal Grandmother      Social History   Tobacco Use   Smoking status: Former   Smokeless tobacco: Never  Building services engineer Use: Never used  Substance Use Topics   Alcohol use: Yes    Comment: twice per month   Drug use: No    Allergies as of 06/08/2022   (No Known Allergies)    Review of Systems:    All systems reviewed and negative except where noted in HPI.   Physical Exam:  LMP 01/26/2021  Patient's last menstrual period was 01/26/2021. Psych:  Alert and cooperative. Normal mood and affect. General:   Alert,  Well-developed, well-nourished, pleasant and cooperative in NAD Head:  Normocephalic and atraumatic. Eyes:  Sclera clear, no icterus.   Conjunctiva pink. Neck:  Supple; no masses or thyromegaly. Lungs:  Respirations even and unlabored.  Clear throughout to auscultation.   No wheezes, crackles, or rhonchi. No acute distress. Heart:  Regular rate and rhythm; no murmurs, clicks, rubs, or gallops. Abdomen:  Normal bowel sounds.  No bruits.  Soft, and non-distended without masses, hepatosplenomegaly or hernias noted.  No Tenderness.  No guarding or rebound tenderness.    Neurologic:  Alert and oriented x3;  grossly normal neurologically. Psych:  Alert and cooperative. Normal mood and affect.  Imaging Studies:  No results found.  Assessment and Plan:   Rachel Brennan is a 37 y.o. y/o female has been referred for constipation and change in bowel habits.  She has had increasing constipation for 1 year, since she had a hysterectomy.  She has tried multiple OTC treatments with little benefit.  She is having some rectal pain prior to bowel movements.  Has had some rectal bleeding in the past.  I am scheduling colonoscopy for further evaluation.  Constipation Gave samples of Linzess 145 mcg QD for 1 week, then 290 mcg QD for 1 week.  She  will let me know which dose works best, and then she can call me back for a prescription.  Recommend High Fiber diet with fruits, vegetables, and whole grains. Drink 64 ounces of Fluids Daily. Start Miralax Mix 1 capful in a drink daily.    Change in Bowel Habits  Scheduling Colonoscopy I discussed risks of colonoscopy with patient to include risk of bleeding, colon perforation, and risk of sedation.   Patient expressed understanding and agrees to proceed with colonoscopy.   Follow up in 4 weeks after colonoscopy  Rachel Amy, PA-C

## 2022-06-08 ENCOUNTER — Ambulatory Visit (INDEPENDENT_AMBULATORY_CARE_PROVIDER_SITE_OTHER): Payer: Medicaid Other | Admitting: Physician Assistant

## 2022-06-08 ENCOUNTER — Encounter: Payer: Self-pay | Admitting: Physician Assistant

## 2022-06-08 ENCOUNTER — Other Ambulatory Visit: Payer: Self-pay | Admitting: *Deleted

## 2022-06-08 VITALS — BP 109/79 | HR 74 | Temp 98.8°F | Ht 61.0 in | Wt 165.2 lb

## 2022-06-08 DIAGNOSIS — K5900 Constipation, unspecified: Secondary | ICD-10-CM | POA: Insufficient documentation

## 2022-06-08 DIAGNOSIS — R194 Change in bowel habit: Secondary | ICD-10-CM | POA: Diagnosis not present

## 2022-06-08 MED ORDER — LINACLOTIDE 145 MCG PO CAPS: 145.0000 ug | ORAL_CAPSULE | Freq: Every day | ORAL | 0 refills | Status: AC

## 2022-06-08 MED ORDER — PEG 3350-KCL-NABCB-NACL-NASULF 236 G PO SOLR: 4000.0000 mL | Freq: Once | ORAL | 0 refills | Status: AC

## 2022-06-08 NOTE — Patient Instructions (Addendum)
Please follow up with Celso Amy, PA-C in 4 weeks after colonscopy

## 2022-06-10 ENCOUNTER — Encounter: Payer: Self-pay | Admitting: Gastroenterology

## 2022-06-17 ENCOUNTER — Ambulatory Visit
Admission: RE | Admit: 2022-06-17 | Discharge: 2022-06-17 | Disposition: A | Discharge status: Home / Self Care | Payer: Medicaid Other | Attending: Gastroenterology | Admitting: Gastroenterology

## 2022-06-17 ENCOUNTER — Other Ambulatory Visit: Payer: Self-pay

## 2022-06-17 ENCOUNTER — Encounter
Admission: RE | Disposition: A | Discharge status: Home / Self Care | Payer: Self-pay | Source: Home / Self Care | Attending: Gastroenterology

## 2022-06-17 ENCOUNTER — Ambulatory Visit: Payer: Medicaid Other | Admitting: Certified Registered"

## 2022-06-17 ENCOUNTER — Encounter: Payer: Self-pay | Admitting: Gastroenterology

## 2022-06-17 DIAGNOSIS — E282 Polycystic ovarian syndrome: Secondary | ICD-10-CM | POA: Diagnosis not present

## 2022-06-17 DIAGNOSIS — K219 Gastro-esophageal reflux disease without esophagitis: Secondary | ICD-10-CM | POA: Diagnosis not present

## 2022-06-17 DIAGNOSIS — K5900 Constipation, unspecified: Secondary | ICD-10-CM

## 2022-06-17 DIAGNOSIS — Z90710 Acquired absence of both cervix and uterus: Secondary | ICD-10-CM | POA: Diagnosis not present

## 2022-06-17 DIAGNOSIS — K5904 Chronic idiopathic constipation: Secondary | ICD-10-CM | POA: Insufficient documentation

## 2022-06-17 HISTORY — PX: COLONOSCOPY WITH PROPOFOL: SHX5780

## 2022-06-17 SURGERY — COLONOSCOPY WITH PROPOFOL
Anesthesia: General

## 2022-06-17 MED ORDER — SODIUM CHLORIDE 0.9 % IV SOLN: INTRAVENOUS | Status: DC

## 2022-06-17 MED ORDER — LIDOCAINE HCL (CARDIAC) PF 100 MG/5ML IV SOSY
PREFILLED_SYRINGE | INTRAVENOUS | Status: DC | PRN
  Administered 2022-06-17: 50 mg via INTRAVENOUS

## 2022-06-17 MED ORDER — MIDAZOLAM HCL 2 MG/2ML IJ SOLN
INTRAMUSCULAR | Status: AC
  Filled 2022-06-17: qty 2

## 2022-06-17 MED ORDER — PROPOFOL 500 MG/50ML IV EMUL
INTRAVENOUS | Status: DC | PRN
  Administered 2022-06-17: 150 ug/kg/min via INTRAVENOUS

## 2022-06-17 MED ORDER — PROPOFOL 10 MG/ML IV BOLUS
INTRAVENOUS | Status: DC | PRN
  Administered 2022-06-17: 30 mg via INTRAVENOUS
  Administered 2022-06-17: 70 mg via INTRAVENOUS

## 2022-06-17 MED ORDER — MIDAZOLAM HCL 5 MG/5ML IJ SOLN
INTRAMUSCULAR | Status: DC | PRN
  Administered 2022-06-17: 2 mg via INTRAVENOUS

## 2022-06-17 NOTE — Transfer of Care (Signed)
Immediate Anesthesia Transfer of Care Note  Patient: Rachel Brennan  Procedure(s) Performed: COLONOSCOPY WITH PROPOFOL  Patient Location: PACU and Endoscopy Unit  Anesthesia Type:General  Level of Consciousness: awake, drowsy, and patient cooperative  Airway & Oxygen Therapy: Patient Spontanous Breathing  Post-op Assessment: Report given to RN and Post -op Vital signs reviewed and stable  Post vital signs: Reviewed and stable  Last Vitals:  Vitals Value Taken Time  BP 107/75 06/17/22 1043  Temp    Pulse 87 06/17/22 1043  Resp 22 06/17/22 1043  SpO2 99 % 06/17/22 1043  Vitals shown include unvalidated device data.  Last Pain:  Vitals:   06/17/22 1042  TempSrc:   PainSc: 0-No pain         Complications: No notable events documented.

## 2022-06-17 NOTE — Anesthesia Postprocedure Evaluation (Signed)
Anesthesia Post Note  Patient: Rachel Brennan  Procedure(s) Performed: COLONOSCOPY WITH PROPOFOL  Patient location during evaluation: Endoscopy Anesthesia Type: General Level of consciousness: awake and alert Pain management: pain level controlled Vital Signs Assessment: post-procedure vital signs reviewed and stable Respiratory status: spontaneous breathing, nonlabored ventilation, respiratory function stable and patient connected to nasal cannula oxygen Cardiovascular status: blood pressure returned to baseline and stable Postop Assessment: no apparent nausea or vomiting Anesthetic complications: no  No notable events documented.   Last Vitals:  Vitals:   06/17/22 1042 06/17/22 1057  BP:  116/81  Pulse: 89   Resp: 18   Temp:  (!) 35.9 C  SpO2: 100%     Last Pain:  Vitals:   06/17/22 1057  TempSrc: Temporal  PainSc: 0-No pain                 Stephanie Coup

## 2022-06-17 NOTE — H&P (Signed)
Wyline Mood, MD [address], [address], [place], [place], [address] [address], [place], [place], [address] Phone: [phone]  Fax: [phone]  Primary Care Physician:  Shane Crutch, [place]   Pre-Procedure History & Physical: HPI:  Rachel Brennan is a 37 y.o. female is here for an colonoscopy.   Past Medical History:  Diagnosis Date   Anemia    PCOS (polycystic ovarian syndrome)    PCOS (polycystic ovarian syndrome)    UTI (lower urinary tract infection)     Past Surgical History:  Procedure Laterality Date   BREAST SURGERY     CESAREAN SECTION     CYSTOSCOPY Bilateral 01/26/2021   Procedure: CYSTOSCOPY;  Surgeon: Nadara Mustard, MD;  Location: [facility] ORS;  Service: Gynecology;  Laterality: Bilateral;   LIPOSUCTION     TOTAL LAPAROSCOPIC HYSTERECTOMY WITH SALPINGECTOMY Bilateral 01/26/2021   Procedure: TOTAL LAPAROSCOPIC HYSTERECTOMY WITH SALPINGECTOMY;  Surgeon: Nadara Mustard, MD;  Location: [facility] ORS;  Service: Gynecology;  Laterality: Bilateral;   WISDOM TOOTH EXTRACTION  2003    Prior to Admission medications   Medication Sig Start Date End Date Taking? Authorizing Provider  linaclotide [facility]) 145 MCG CAPS capsule Take 1 capsule (145 mcg total) by mouth daily before breakfast. 06/08/22  Yes Celso Amy, PA-C  Tirzepatide [facility]) Inject into the skin once a week. On monday   Yes [provider]  spironolactone (ALDACTONE) 100 MG tablet Take 100 mg by mouth daily. 10/15/21   [provider]    Allergies as of 06/08/2022   (No Known Allergies)    Family History  Problem Relation Age of Onset   Hypertension Father    Anemia Mother    Hypertension Mother    Diabetes Maternal Grandmother     Social History   Socioeconomic History   Marital status: Married    Spouse name: Not on file   Number of children: Not on file   Years of education: Not on file   Highest education level: Not on file  Occupational  History   Not on file  Tobacco Use   Smoking status: Former   Smokeless tobacco: Never  Vaping Use   Vaping Use: Never used  Substance and Sexual Activity   Alcohol use: Yes    Comment: twice per month   Drug use: No   Sexual activity: Yes  Other Topics Concern   Not on file  Social History Narrative   Not on file   Social Determinants of Health   Financial Resource Strain: Not on file  Food Insecurity: Not on file  Transportation Needs: Not on file  Physical Activity: Not on file  Stress: Not on file  Social Connections: Not on file  Intimate Partner Violence: Not on file    Review of Systems: See HPI, otherwise negative ROS  Physical Exam: BP 106/83   Pulse 81   Temp (!) 96.9 F (36.1 C) (Temporal)   Resp 16   Wt 74.8 kg   LMP 01/26/2021   SpO2 99%   BMI 31.18 kg/m  General:   Alert,  pleasant and cooperative in NAD Head:  Normocephalic and atraumatic. Neck:  Supple; no masses or thyromegaly. Lungs:  Clear throughout to auscultation, normal respiratory effort.    Heart:  +S1, +S2, Regular rate and rhythm, No edema. Abdomen:  Soft, nontender and nondistended. Normal bowel sounds, without guarding, and without rebound.   Neurologic:  Alert and  oriented x4;  grossly normal  neurologically.  Impression/Plan: Rachel Brennan is here for an colonoscopy to be performed for constipation Risks, benefits, limitations, and alternatives regarding  colonoscopy have been reviewed with the patient.  Questions have been answered.  All parties agreeable.   Wyline Mood, MD  06/17/2022, 10:16 AM

## 2022-06-17 NOTE — Anesthesia Preprocedure Evaluation (Addendum)
Anesthesia Evaluation  Patient identified by MRN, date of birth, ID band Patient awake    Reviewed: Allergy & Precautions, NPO status , Patient's Chart, lab work & pertinent test results  History of Anesthesia Complications Negative for: history of anesthetic complications  Airway Mallampati: II  TM Distance: >3 FB Neck ROM: Full    Dental no notable dental hx. (+) Dental Advidsory Given   Pulmonary neg pulmonary ROS, neg COPD, former smoker   Pulmonary exam normal breath sounds clear to auscultation       Cardiovascular Exercise Tolerance: Good negative cardio ROS Normal cardiovascular exam Rhythm:Regular Rate:Normal     Neuro/Psych negative neurological ROS  negative psych ROS   GI/Hepatic negative GI ROS, Neg liver ROS,GERD  Controlled,,  Endo/Other  negative endocrine ROS  Obesity, PCOS  Renal/GU negative Renal ROS  negative genitourinary   Musculoskeletal   Abdominal   Peds  Hematology negative hematology ROS (+) Blood dyscrasia, anemia   Anesthesia Other Findings Past Medical History: No date: Anemia No date: PCOS (polycystic ovarian syndrome) No date: PCOS (polycystic ovarian syndrome) No date: UTI (lower urinary tract infection)  Past Surgical History: No date: BREAST SURGERY No date: CESAREAN SECTION 01/26/2021: CYSTOSCOPY; Bilateral     Comment:  Procedure: CYSTOSCOPY;  Surgeon: Nadara Mustard, MD;                Location: [facility] ORS;  Service: Gynecology;  Laterality:               Bilateral; No date: LIPOSUCTION 01/26/2021: TOTAL LAPAROSCOPIC HYSTERECTOMY WITH SALPINGECTOMY;  Bilateral     Comment:  Procedure: TOTAL LAPAROSCOPIC HYSTERECTOMY WITH               SALPINGECTOMY;  Surgeon: Nadara Mustard, MD;  Location:              [facility] OR[facility];  Service: Gynecology;  Laterality: Bilateral; 2003: WISDOM TOOTH EXTRACTION  BMI    Body Mass Index: 31.18 kg/m      Reproductive/Obstetrics negative  OB ROS Fibroids                             Anesthesia Physical Anesthesia Plan  ASA: 3  Anesthesia Plan: General   Post-op Pain Management:    Induction: Intravenous  PONV Risk Score and Plan: 3 and Ondansetron, Propofol infusion and TIVA  Airway Management Planned: Nasal Cannula and Natural Airway  Additional Equipment:   Intra-op Plan:   Post-operative Plan: Extubation in OR  Informed Consent: I have reviewed the patients History and Physical, chart, labs and discussed the procedure including the risks, benefits and alternatives for the proposed anesthesia with the patient or authorized representative who has indicated his/her understanding and acceptance.     Dental advisory given  Plan Discussed with: CRNA  Anesthesia Plan Comments: (Patient consented for risks of anesthesia including but not limited to:  - adverse reactions to medications - damage to eyes, teeth, lips or other oral mucosa - nerve damage due to positioning  - sore throat or hoarseness - damage to heart, brain, nerves, lungs, other parts of body or loss of life  Informed patient about role of CRNA in peri- and intra-operative care.  Patient voiced understanding.)        Anesthesia Quick Evaluation

## 2022-06-17 NOTE — Op Note (Signed)
[facility] Patient Name: Rachel Brennan Procedure Date: 06/17/2022 10:14 AM MRN: [id number] Account #: [id number] Date of Birth: [birth year] Admit Type: Outpatient Age: 37 Room: [facility] ENDO ROOM 4 Gender: Female Note Status: Finalized Instrument Name: Nelda Marseille [id number] Procedure:             Colonoscopy Indications:           Chronic idiopathic constipation Providers:             Wyline Mood MD, MD Referring MD:          No Local Md, MD (Referring MD) Medicines:             Monitored Anesthesia Care Complications:         No immediate complications. Procedure:             Pre-Anesthesia Assessment:                        - Prior to the procedure, a History and Physical was                         performed, and patient medications, allergies and                         sensitivities were reviewed. The patient's tolerance                         of previous anesthesia was reviewed.                        - The risks and benefits of the procedure and the                         sedation options and risks were discussed with the                         patient. All questions were answered and informed                         consent was obtained.                        - ASA Grade Assessment: II - A patient with mild                         systemic disease.                        After obtaining informed consent, the colonoscope was                         passed under direct vision. Throughout the procedure,                         the patient's blood pressure, pulse, and oxygen                         saturations were monitored continuously. The                         Colonoscope was introduced through  the anus and                         advanced to the the cecum, identified by the                         appendiceal orifice. The colonoscopy was performed                         with ease. The patient tolerated the procedure well.                          The quality of the bowel preparation was excellent.                         The ileocecal valve, appendiceal orifice, and rectum                         were photographed. Findings:      The perianal and digital rectal examinations were normal.      The entire examined colon appeared normal on direct and retroflexion       views. Impression:            - The entire examined colon is normal on direct and                         retroflexion views.                        - No specimens collected. Recommendation:        - Discharge patient to home (with escort).                        - Resume previous diet.                        - Continue present medications.                        - Return to GI office as previously scheduled. Procedure Code(s):     --- Professional ---                        [phone], Colonoscopy, flexible; diagnostic, including                         collection of specimen(s) by brushing or washing, when                         performed (separate procedure) Diagnosis Code(s):     --- Professional ---                        K59.04, Chronic idiopathic constipation CPT copyright 2022 [employer]. All rights reserved. The codes documented in this report are preliminary and upon coder review may  be revised to meet current compliance requirements. Wyline Mood, MD Wyline Mood MD, MD 06/17/2022 10:40:59 AM This report has been signed electronically. Number of Addenda: 0 Note Initiated On: 06/17/2022 10:14 AM Scope Withdrawal Time: 0 hours 8 minutes 31 seconds  Total Procedure Duration: 0  hours 11 minutes 55 seconds  Estimated Blood Loss:  Estimated blood loss: none.      [facility]

## 2022-06-20 ENCOUNTER — Encounter: Payer: Self-pay | Admitting: Gastroenterology

## 2022-07-18 ENCOUNTER — Ambulatory Visit: Payer: Medicaid Other | Admitting: Physician Assistant

## 2022-07-18 NOTE — Progress Notes (Deleted)
    Celso Amy, PA-C [address]  [address]  [place], [place] [address]  Main: [phone]  Fax: [phone]   Primary Care Physician: Shane Crutch, [place]  Primary Gastroenterologist:  ***  CC: Follow-up constipation and change in bowel habits  HPI: Rachel Brennan is a 37 y.o. female returns for follow-up of constipation and change in bowel habits.  She was given samples of Linzess to try at her last office visit.  Colonoscopy 06/17/2022 by Dr. Tobi Bastos was normal.  Excellent prep.  Current Outpatient Medications  Medication Sig Dispense Refill   linaclotide (LINZESS) 145 MCG CAPS capsule Take 1 capsule (145 mcg total) by mouth daily before breakfast. 30 capsule 0   spironolactone (ALDACTONE) 100 MG tablet Take 100 mg by mouth daily.     Tirzepatide [facility]) Inject into the skin once a week. On monday     No current facility-administered medications for this visit.    Allergies as of 07/18/2022   (No Known Allergies)    Past Medical History:  Diagnosis Date   Anemia    PCOS (polycystic ovarian syndrome)    PCOS (polycystic ovarian syndrome)    UTI (lower urinary tract infection)     Past Surgical History:  Procedure Laterality Date   BREAST SURGERY     CESAREAN SECTION     COLONOSCOPY WITH PROPOFOL N/A 06/17/2022   Procedure: COLONOSCOPY WITH PROPOFOL;  Surgeon: Wyline Mood, MD;  Location: [facility] ENDOSCOPY;  Service: Gastroenterology;  Laterality: N/A;   CYSTOSCOPY Bilateral 01/26/2021   Procedure: CYSTOSCOPY;  Surgeon: Nadara Mustard, MD;  Location: [facility] ORS;  Service: Gynecology;  Laterality: Bilateral;   LIPOSUCTION     TOTAL LAPAROSCOPIC HYSTERECTOMY WITH SALPINGECTOMY Bilateral 01/26/2021   Procedure: TOTAL LAPAROSCOPIC HYSTERECTOMY WITH SALPINGECTOMY;  Surgeon: Nadara Mustard, MD;  Location: [facility] ORS;  Service: Gynecology;  Laterality: Bilateral;   WISDOM TOOTH EXTRACTION  2003    Review of Systems:    All systems reviewed and negative except  where noted in HPI.   Physical Examination:   LMP 01/26/2021   General: Well-nourished, well-developed in no acute distress.  Eyes: No icterus. Conjunctivae pink. Mouth: Oropharyngeal mucosa moist and pink , no lesions erythema or exudate. Lungs: Clear to auscultation bilaterally. Non-labored. Heart: Regular rate and rhythm, no murmurs rubs or gallops.  Abdomen: Bowel sounds are normal; Abdomen is Soft; No hepatosplenomegaly, masses or hernias;  No Abdominal Tenderness; No guarding or rebound tenderness. Extremities: No lower extremity edema. No clubbing or deformities. Neuro: Alert and oriented x 3.  Grossly intact. Skin: Warm and dry, no jaundice.   Psych: Alert and cooperative, normal mood and affect.   Imaging Studies: No results found.  Assessment and Plan:   Rachel Brennan is a 37 y.o. y/o female presents for follow-up of constipation.  Recent colonoscopy was normal.  She tried samples of Linzess.    Celso Amy, PA-C  Follow up in ***  BP check ***

## 2023-05-07 IMAGING — US US PELVIS COMPLETE WITH TRANSVAGINAL
1 series · 13 of 25 positions shown · non-contrast
Comparison: Prior ultrasound from 03/13/2018.

CLINICAL DATA: Initial evaluation for uterine fibroids.
Menorrhagia.



[Series 1: us pelvis complete with transvaginal · 0.23mm/px · 13 of 123 slices shown]
[im 1/123]
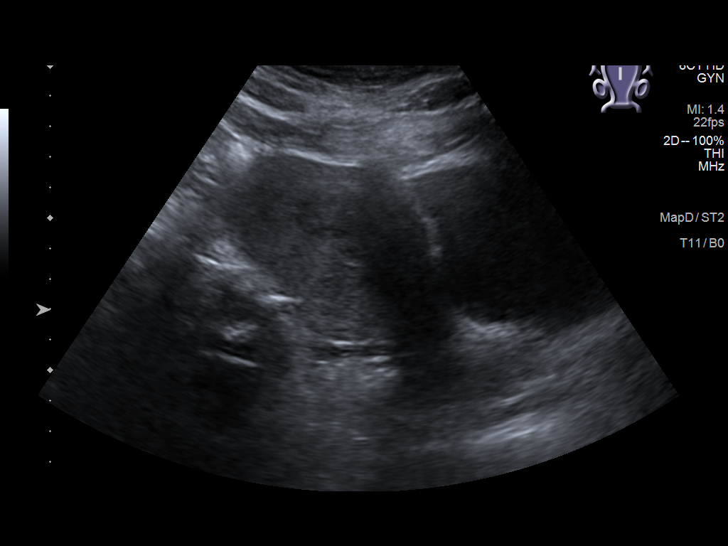
[im 11/123]
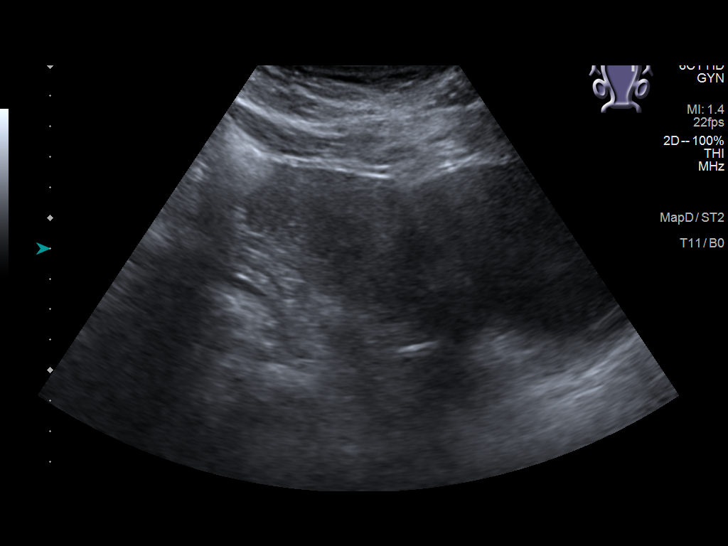
[im 21/123]
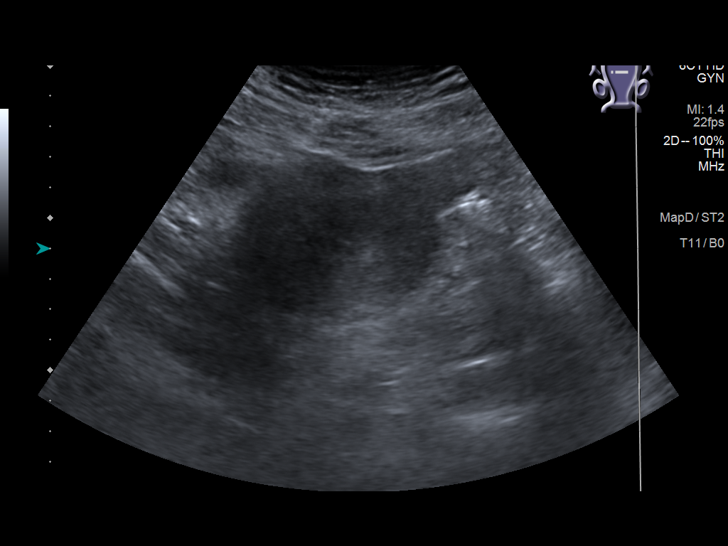
[im 31/123]
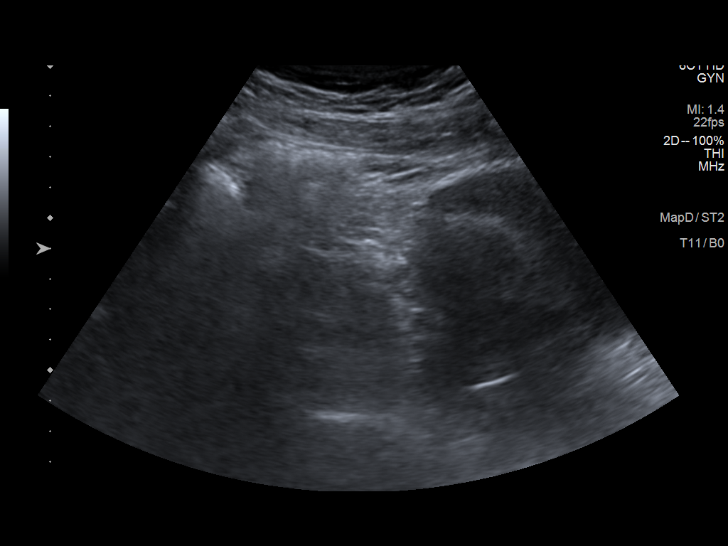
[im 41/123]
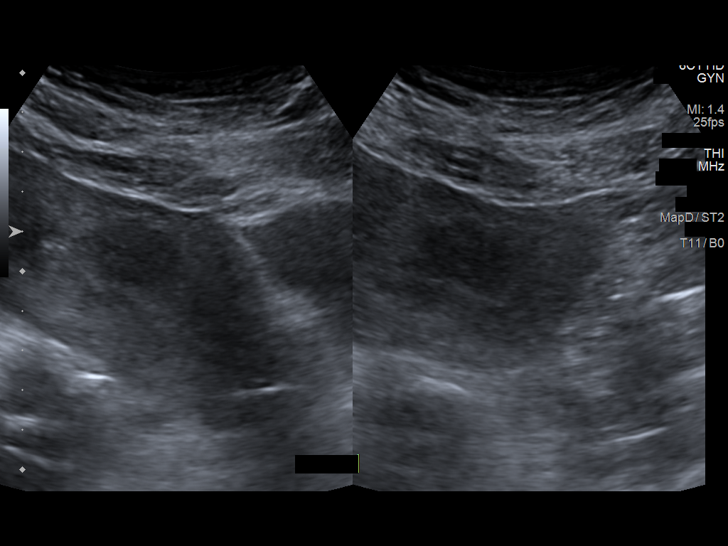
[im 51/123]
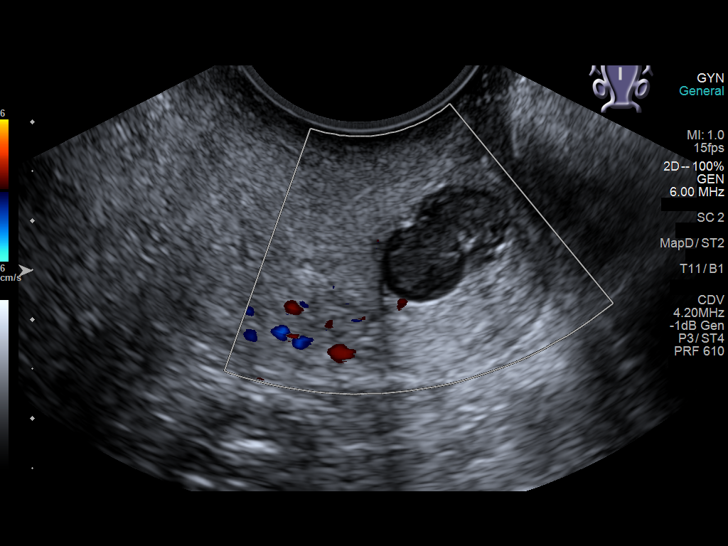
[im 62/123]
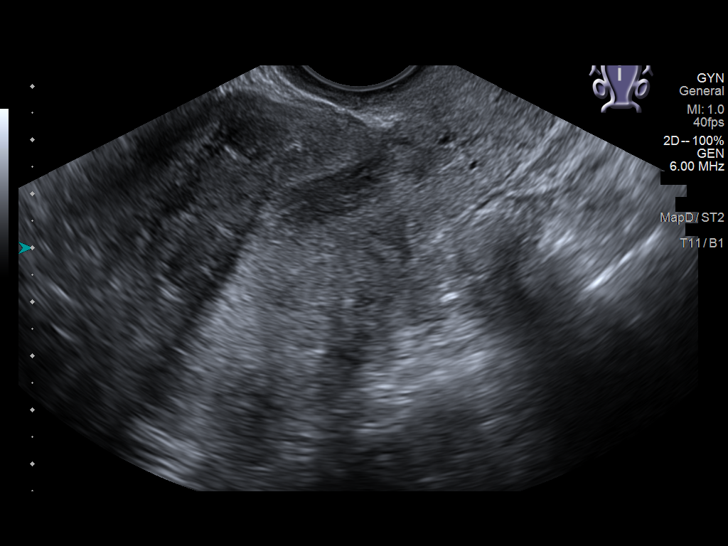
[im 72/123]
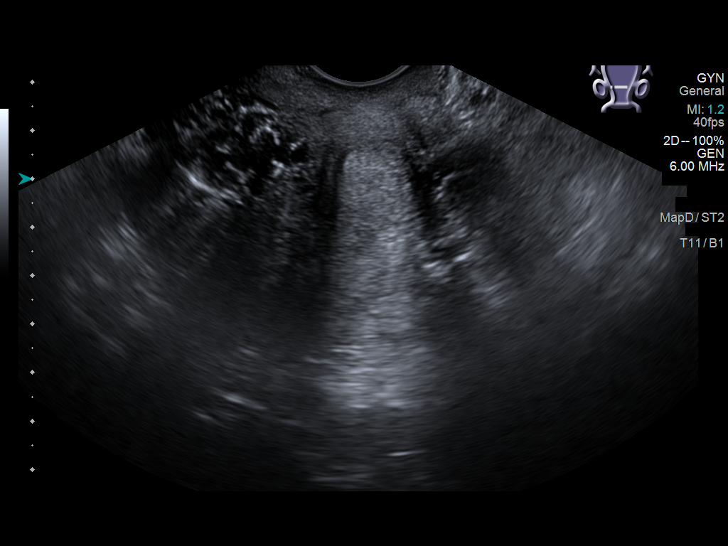
[im 82/123]
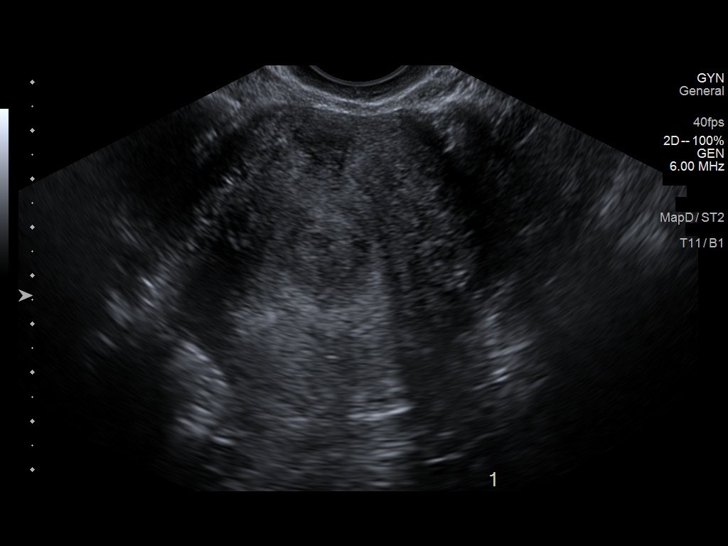
[im 92/123]
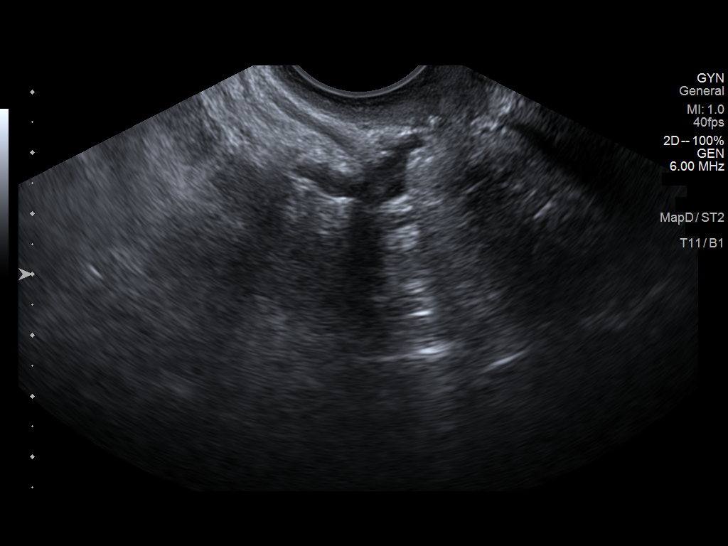
[im 102/123]
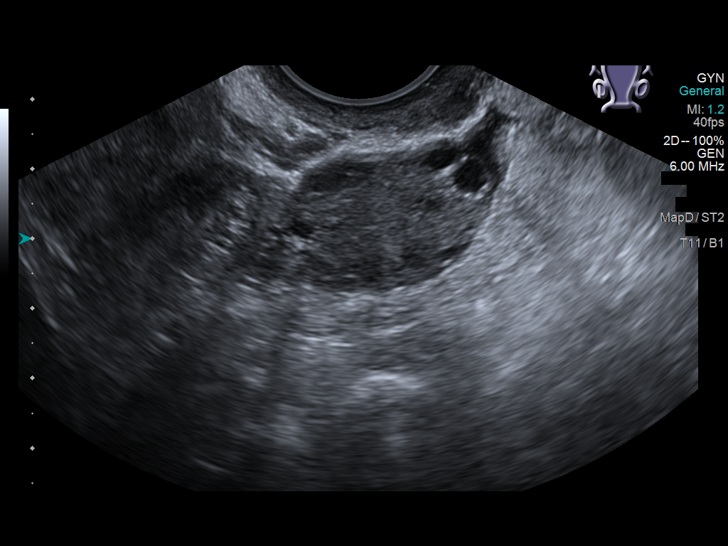
[im 112/123]
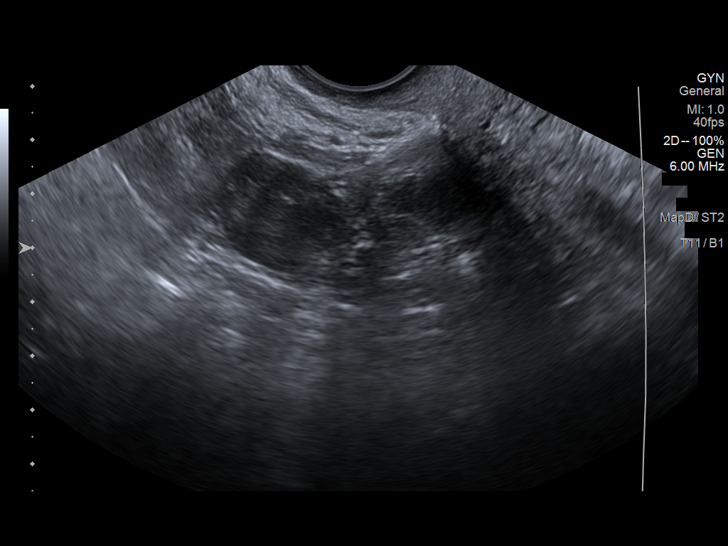
[im 123/123]
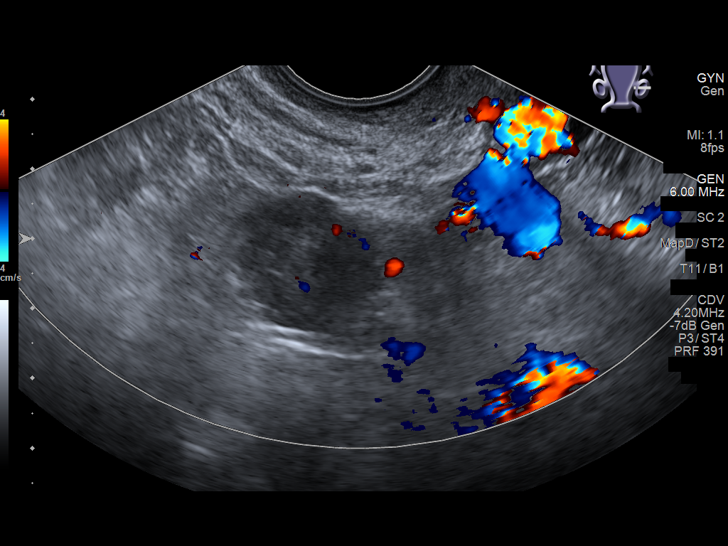

[13 of 25 positions shown; findings below may reference images not displayed]

FINDINGS: Uterus

Measurements: 9.6 x 5 9 x 6.3 cm = volume: 6406 mL. Uterus is
anteverted. 1.7 x 1.6 x 1.4 cm intramural to submucosal fibroid
present at the left mid uterine body. Additional 1.1 x 0.8 x 0.8 cm
intramural fibroid present at the left lower uterine segment.
Prominent 1.4 cm nabothian cyst noted at the cervix.

Endometrium

Thickness: 12 mm.  No focal abnormality visualized.

Right ovary

Measurements: 3.2 x 2.5 x 2.8 cm = volume: 11.7 mL. Normal
appearance/no adnexal mass.

Left ovary

Measurements: 2.3 x 2.2 x 1.8 cm = volume: 4 9 mL. Normal
appearance/no adnexal mass.

Other findings

Trace free fluid seen adjacent to the right ovary.
IMPRESSION: 1. Two small uterine fibroids as above, largest of which measures
1.7 cm with an associated submucosal component.
2. Endometrial stripe measures 12 mm in thickness. If bleeding
remains unresponsive to hormonal or medical therapy, sonohysterogram
should be considered for focal lesion work-up. (Ref: Radiological
Reasoning: Algorithmic Workup of Abnormal Vaginal Bleeding with
Endovaginal Sonography and Sonohysterography. AJR [id number]; 191:S68-73).
3. Normal sonographic appearance of the ovaries.  No adnexal mass.

## 2023-06-06 ENCOUNTER — Emergency Department
Admission: EM | Admit: 2023-06-06 | Discharge: 2023-06-06 | Discharge status: Against Medical Advice | Attending: Emergency Medicine | Admitting: Emergency Medicine

## 2023-06-06 ENCOUNTER — Other Ambulatory Visit: Payer: Self-pay

## 2023-06-06 DIAGNOSIS — Z5321 Procedure and treatment not carried out due to patient leaving prior to being seen by health care provider: Secondary | ICD-10-CM | POA: Diagnosis not present

## 2023-06-06 DIAGNOSIS — W57XXXA Bitten or stung by nonvenomous insect and other nonvenomous arthropods, initial encounter: Secondary | ICD-10-CM | POA: Diagnosis not present

## 2023-06-06 DIAGNOSIS — S40862A Insect bite (nonvenomous) of left upper arm, initial encounter: Secondary | ICD-10-CM | POA: Insufficient documentation

## 2023-06-06 NOTE — ED Triage Notes (Signed)
 Pt to ED via POV c/o insect bite to left upper arm. Noticed it today. Thinks it was a brown recluse but did not see it
